# Patient Record
Sex: Female | Born: 1965
Health system: Southern US, Community
[De-identification: ages and names within clinical notes are randomized; demographics above are authoritative.]

## PROBLEM LIST (undated history)

## (undated) DIAGNOSIS — D649 Anemia, unspecified: Secondary | ICD-10-CM

## (undated) DIAGNOSIS — I10 Essential (primary) hypertension: Secondary | ICD-10-CM

## (undated) HISTORY — DX: Anemia, unspecified: D64.9

## (undated) HISTORY — PX: WISDOM TOOTH EXTRACTION: SHX21

## (undated) HISTORY — DX: Essential (primary) hypertension: I10

---

## 1999-01-31 ENCOUNTER — Other Ambulatory Visit: Admission: RE | Admit: 1999-01-31 | Discharge: 1999-01-31 | Payer: Self-pay | Admitting: *Deleted

## 1999-02-20 ENCOUNTER — Inpatient Hospital Stay (HOSPITAL_COMMUNITY): Admission: AD | Admit: 1999-02-20 | Discharge: 1999-02-20 | Payer: Self-pay | Admitting: Obstetrics and Gynecology

## 1999-02-28 ENCOUNTER — Inpatient Hospital Stay (HOSPITAL_COMMUNITY): Admission: AD | Admit: 1999-02-28 | Discharge: 1999-02-28 | Payer: Self-pay | Admitting: Obstetrics and Gynecology

## 1999-03-09 ENCOUNTER — Inpatient Hospital Stay (HOSPITAL_COMMUNITY): Admission: AD | Admit: 1999-03-09 | Discharge: 1999-03-09 | Payer: Self-pay | Admitting: Obstetrics and Gynecology

## 1999-07-26 ENCOUNTER — Inpatient Hospital Stay (HOSPITAL_COMMUNITY): Admission: AD | Admit: 1999-07-26 | Discharge: 1999-07-29 | Payer: Self-pay | Admitting: Obstetrics and Gynecology

## 1999-08-30 ENCOUNTER — Other Ambulatory Visit: Admission: RE | Admit: 1999-08-30 | Discharge: 1999-08-30 | Payer: Self-pay | Admitting: Obstetrics and Gynecology

## 2000-10-05 ENCOUNTER — Other Ambulatory Visit: Admission: RE | Admit: 2000-10-05 | Discharge: 2000-10-05 | Payer: Self-pay | Admitting: Obstetrics and Gynecology

## 2000-10-28 ENCOUNTER — Ambulatory Visit (HOSPITAL_BASED_OUTPATIENT_CLINIC_OR_DEPARTMENT_OTHER): Admission: RE | Admit: 2000-10-28 | Discharge: 2000-10-28 | Payer: Self-pay | Admitting: Orthopedic Surgery

## 2001-08-26 ENCOUNTER — Encounter: Admission: RE | Admit: 2001-08-26 | Discharge: 2001-08-26 | Payer: Self-pay | Admitting: Obstetrics and Gynecology

## 2001-08-26 ENCOUNTER — Encounter: Payer: Self-pay | Admitting: Obstetrics and Gynecology

## 2001-10-01 ENCOUNTER — Other Ambulatory Visit: Admission: RE | Admit: 2001-10-01 | Discharge: 2001-10-01 | Payer: Self-pay | Admitting: Obstetrics and Gynecology

## 2003-01-17 ENCOUNTER — Other Ambulatory Visit: Admission: RE | Admit: 2003-01-17 | Discharge: 2003-01-17 | Payer: Self-pay | Admitting: Obstetrics and Gynecology

## 2004-01-23 ENCOUNTER — Other Ambulatory Visit: Admission: RE | Admit: 2004-01-23 | Discharge: 2004-01-23 | Payer: Self-pay | Admitting: Obstetrics and Gynecology

## 2004-01-26 ENCOUNTER — Emergency Department (HOSPITAL_COMMUNITY): Admission: AD | Admit: 2004-01-26 | Discharge: 2004-01-26 | Payer: Self-pay | Admitting: Family Medicine

## 2005-01-22 ENCOUNTER — Other Ambulatory Visit: Admission: RE | Admit: 2005-01-22 | Discharge: 2005-01-22 | Payer: Self-pay | Admitting: Obstetrics and Gynecology

## 2005-03-18 ENCOUNTER — Emergency Department (HOSPITAL_COMMUNITY): Admission: EM | Admit: 2005-03-18 | Discharge: 2005-03-18 | Payer: Self-pay | Admitting: Family Medicine

## 2005-11-24 HISTORY — PX: BREAST SURGERY: SHX581

## 2006-11-02 ENCOUNTER — Ambulatory Visit (HOSPITAL_BASED_OUTPATIENT_CLINIC_OR_DEPARTMENT_OTHER): Admission: RE | Admit: 2006-11-02 | Discharge: 2006-11-03 | Payer: Self-pay | Admitting: Specialist

## 2011-11-21 ENCOUNTER — Emergency Department (HOSPITAL_COMMUNITY): Payer: 59

## 2011-11-21 ENCOUNTER — Emergency Department (HOSPITAL_COMMUNITY)
Admission: EM | Admit: 2011-11-21 | Discharge: 2011-11-21 | Disposition: A | Discharge status: Home / Self Care | Payer: 59 | Attending: Emergency Medicine | Admitting: Emergency Medicine

## 2011-11-21 DIAGNOSIS — M25559 Pain in unspecified hip: Secondary | ICD-10-CM

## 2011-11-21 MED ORDER — OXYCODONE-ACETAMINOPHEN 5-325 MG PO TABS: 1.0000 | ORAL_TABLET | Freq: Four times a day (QID) | ORAL | Status: AC | PRN

## 2011-11-21 MED ORDER — NAPROXEN 500 MG PO TABS: 500.0000 mg | ORAL_TABLET | Freq: Two times a day (BID) | ORAL | Status: AC

## 2011-11-21 MED ORDER — OXYCODONE-ACETAMINOPHEN 5-325 MG PO TABS
1.0000 | ORAL_TABLET | Freq: Once | ORAL | Status: AC
  Administered 2011-11-21: 1 via ORAL
  Filled 2011-11-21: qty 1

## 2011-11-21 NOTE — ED Notes (Signed)
sts this am was walking and left leg gave out and she fell. Describes more as a collapsed. Difficulty walking due to pain. Hands now have numbness tingling in bilateral arms and ankle on left leg.

## 2011-11-21 NOTE — ED Notes (Signed)
Pt getting dressed to be discharged home 

## 2011-11-21 NOTE — ED Notes (Signed)
Pt here from home, for left hip pain, sts leg gave out while walking down hall and collapsed on floor, now with worsening hip pain on left side, numbness and tingling in arms hands and ankle on left side.

## 2011-11-21 NOTE — Progress Notes (Signed)
Orthopedic Tech Progress Note Patient Details:  Penny Ward Oct 12, 1966 [id number]  Other Ortho Devices Type of Ortho Device: Crutches Ortho Device Interventions: Application   Nikki Dom 11/21/2011, 6:13 PM

## 2011-11-21 NOTE — ED Provider Notes (Signed)
History     CSN: [id number]  Arrival date & time 11/21/11  1312   First MD Initiated Contact with Patient 11/21/11 1511      Chief Complaint  Patient presents with  . Hip Pain    (Consider location/radiation/quality/duration/timing/severity/associated sxs/prior treatment) Patient is a 45 y.o. female presenting with hip pain. The history is provided by the patient (States that having severe pain in her left hip. Also some pain in her left. This pain started when she bent to to pick something up. Patient complains of pain with ambulation.).  Hip Pain This is a new problem. The current episode started 3 to 5 hours ago. The problem occurs constantly. The problem has not changed since onset.Pertinent negatives include no chest pain, no abdominal pain and no headaches. The symptoms are aggravated by bending and walking. The symptoms are relieved by nothing. She has tried nothing for the symptoms.    History reviewed. No pertinent past medical history.  History reviewed. No pertinent past surgical history.  History reviewed. No pertinent family history.  History  Substance Use Topics  . Smoking status: Never Smoker   . Smokeless tobacco: Not on file  . Alcohol Use: No    OB History    Grav Para Term Preterm Abortions TAB SAB Ect Mult Living                  Review of Systems  Constitutional: Negative for fatigue.  HENT: Negative for congestion, sinus pressure and ear discharge.   Eyes: Negative for discharge.  Respiratory: Negative for cough.   Cardiovascular: Negative for chest pain.  Gastrointestinal: Negative for abdominal pain and diarrhea.  Genitourinary: Negative for frequency and hematuria.  Musculoskeletal: Negative for back pain.       Moderate left hip pain  Skin: Negative for rash.  Neurological: Negative for seizures and headaches.  Hematological: Negative.   Psychiatric/Behavioral: Negative for hallucinations.    Allergies  Review of patient's allergies  indicates no known allergies.  Home Medications   Current Outpatient Rx  Name Route Sig Dispense Refill  . PRESCRIPTION MEDICATION Oral Take 1 tablet by mouth daily. Birth control pills     . NAPROXEN 500 MG PO TABS Oral Take 1 tablet (500 mg total) by mouth 2 (two) times daily. 30 tablet 0  . OXYCODONE-ACETAMINOPHEN 5-325 MG PO TABS Oral Take 1 tablet by mouth every 6 (six) hours as needed for pain. 30 tablet 0    BP 115/74  Pulse 75  Temp(Src) 98.5 F (36.9 C) (Oral)  Resp 18  SpO2 98%  Physical Exam  Constitutional: She is oriented to person, place, and time. She appears well-developed.  HENT:  Head: Normocephalic and atraumatic.  Eyes: Conjunctivae and EOM are normal. No scleral icterus.  Neck: Neck supple. No thyromegaly present.  Cardiovascular: Normal rate and regular rhythm.  Exam reveals no gallop and no friction rub.   No murmur heard. Pulmonary/Chest: No stridor. She has no wheezes. She has no rales. She exhibits no tenderness.  Abdominal: She exhibits no distension. There is no tenderness. There is no rebound.  Musculoskeletal: Normal range of motion. She exhibits tenderness. She exhibits no edema.       Patient has tenderness in left posterior hip with palpation. Patient also has severe pain left hip with flexion at the hip reflexes are normal neurovascular exam of the  Lymphadenopathy:    She has no cervical adenopathy.  Neurological: She is oriented to person, place, and time. She  displays normal reflexes. Coordination normal.  Skin: No rash noted. No erythema.  Psychiatric: She has a normal mood and affect. Her behavior is normal.    ED Course  Procedures (including critical care time)  Labs Reviewed - No data to display Dg Hip Complete Left  11/21/2011  *RADIOLOGY REPORT*  Clinical Data: Pain, acute onset  LEFT HIP - COMPLETE 2+ VIEW  Comparison: None.  Findings: No evidence of fracture, dislocation, degenerative change or other focal lesion.  Other bones  of the pelvis appear normal.  IMPRESSION: Normal radiographs  Original Report Authenticated By: Thomasenia Sales, M.D.     1. Hip pain       MDM          Benny Lennert, MD 11/21/11 [phone]

## 2012-12-16 ENCOUNTER — Other Ambulatory Visit: Payer: Self-pay | Admitting: Obstetrics and Gynecology

## 2012-12-16 DIAGNOSIS — N6009 Solitary cyst of unspecified breast: Secondary | ICD-10-CM

## 2012-12-27 ENCOUNTER — Ambulatory Visit
Admission: RE | Admit: 2012-12-27 | Discharge: 2012-12-27 | Disposition: A | Discharge status: Home / Self Care | Payer: BC Managed Care – PPO | Source: Ambulatory Visit | Attending: Obstetrics and Gynecology | Admitting: Obstetrics and Gynecology

## 2012-12-27 DIAGNOSIS — N6009 Solitary cyst of unspecified breast: Secondary | ICD-10-CM

## 2013-04-04 ENCOUNTER — Other Ambulatory Visit: Payer: Self-pay | Admitting: Obstetrics and Gynecology

## 2013-04-04 DIAGNOSIS — N6002 Solitary cyst of left breast: Secondary | ICD-10-CM

## 2013-04-15 ENCOUNTER — Ambulatory Visit
Admission: RE | Admit: 2013-04-15 | Discharge: 2013-04-15 | Disposition: A | Discharge status: Home / Self Care | Payer: BC Managed Care – PPO | Source: Ambulatory Visit | Attending: Obstetrics and Gynecology | Admitting: Obstetrics and Gynecology

## 2013-04-15 DIAGNOSIS — N6002 Solitary cyst of left breast: Secondary | ICD-10-CM

## 2013-10-11 IMAGING — MG MM DIGITAL DIAGNOSTIC BILAT
4 series · 4 of 4 positions shown · non-contrast
Comparison: With priors

CLINICAL DATA: Annual examination.  Follow-up from December 2012.
A cluster of benign cysts was seen in the left breast at 5 o'clock
positions subareolar.

DIGITAL DIAGNOSTIC BILATERAL MAMMOGRAM WITH CAD

[R CC]
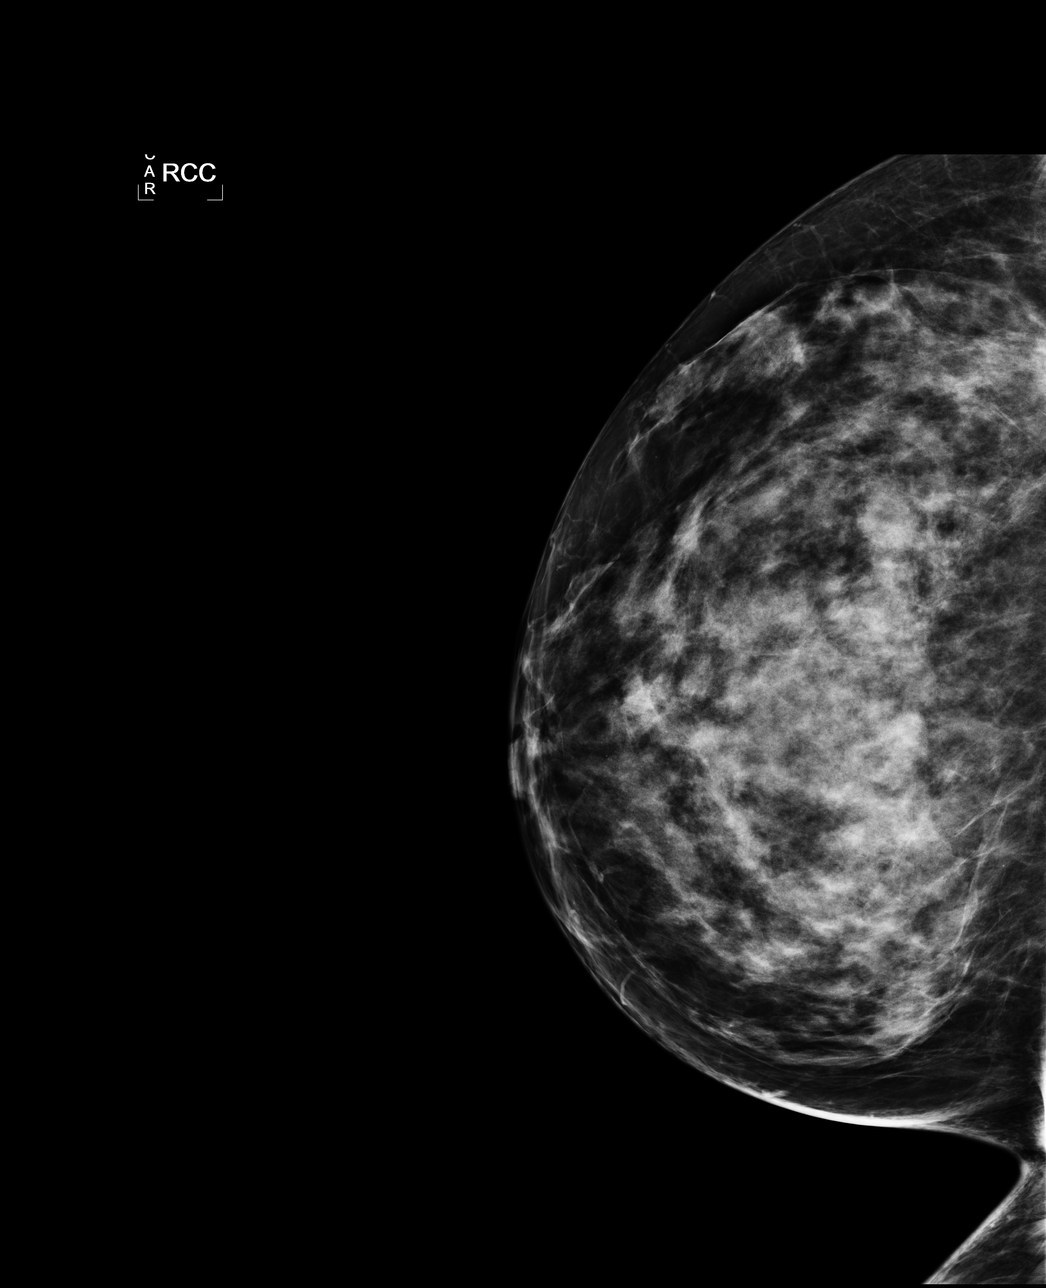

[L CC]
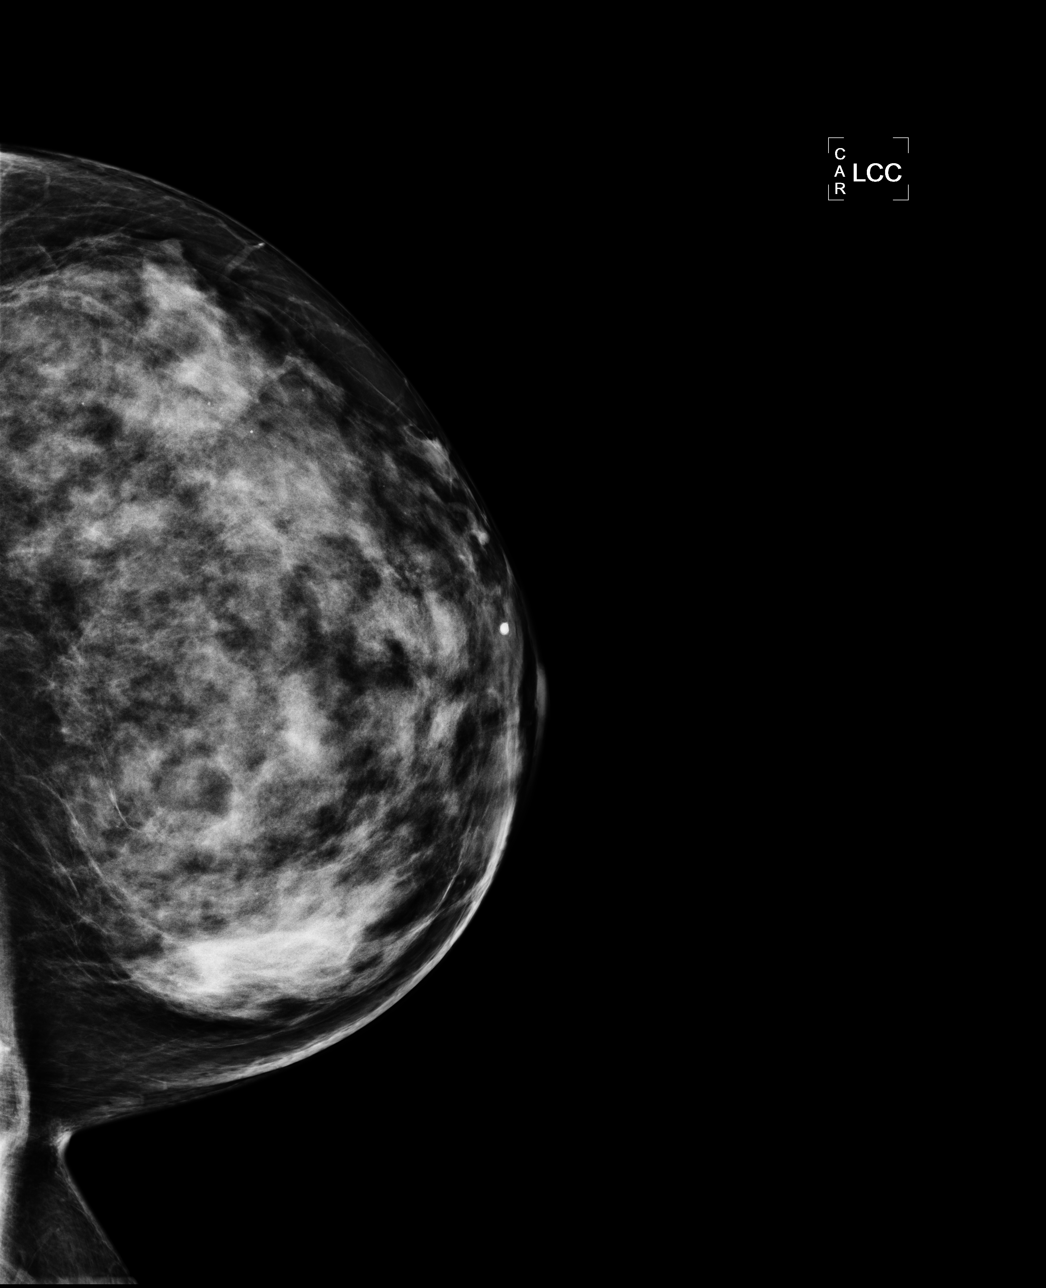

[L MLO]
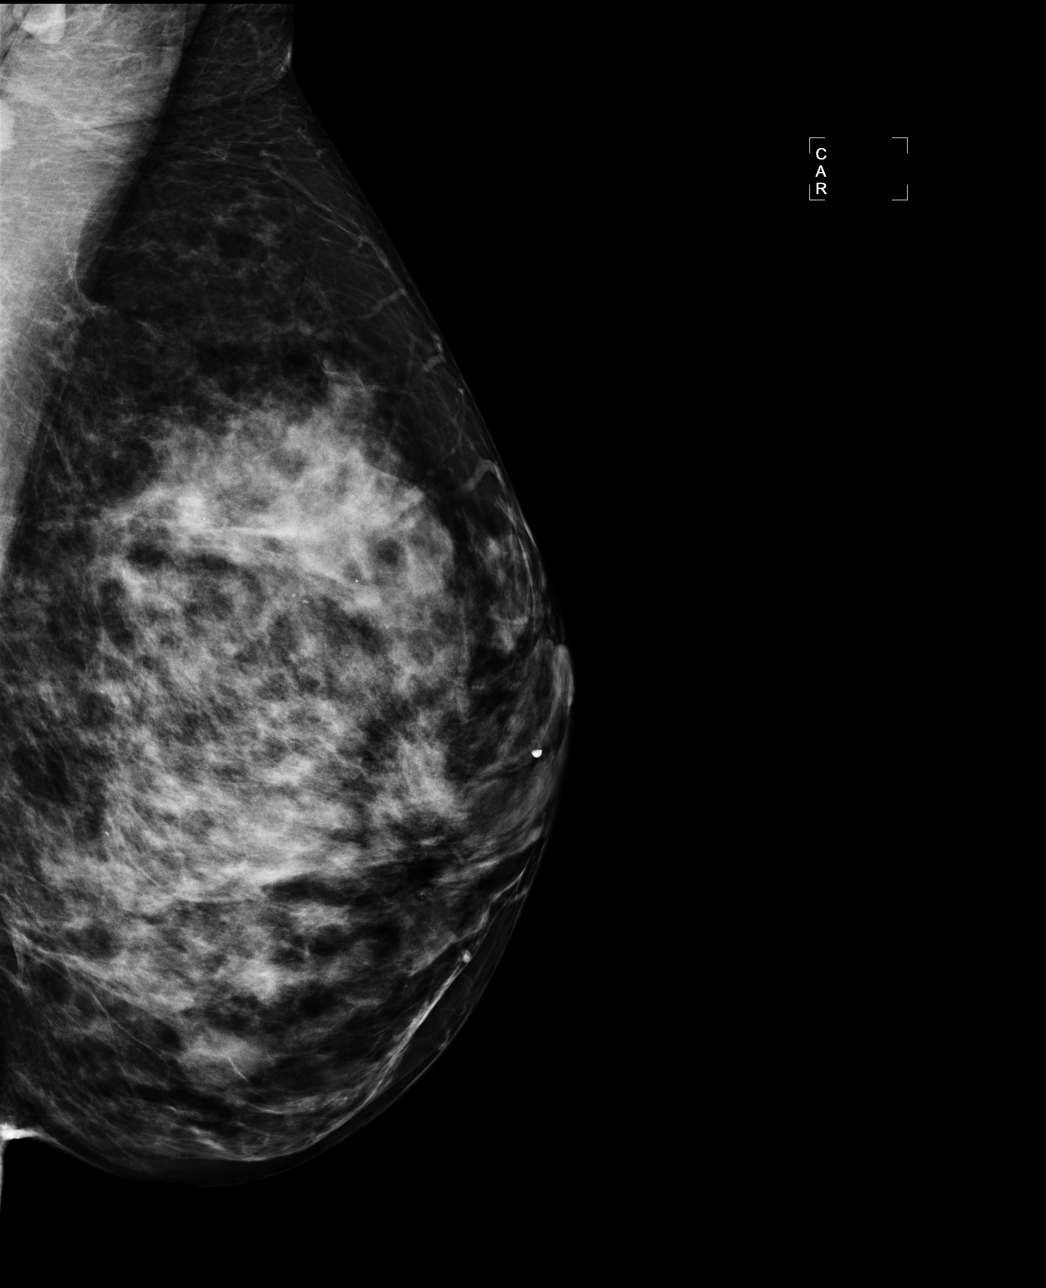

[R MLO]
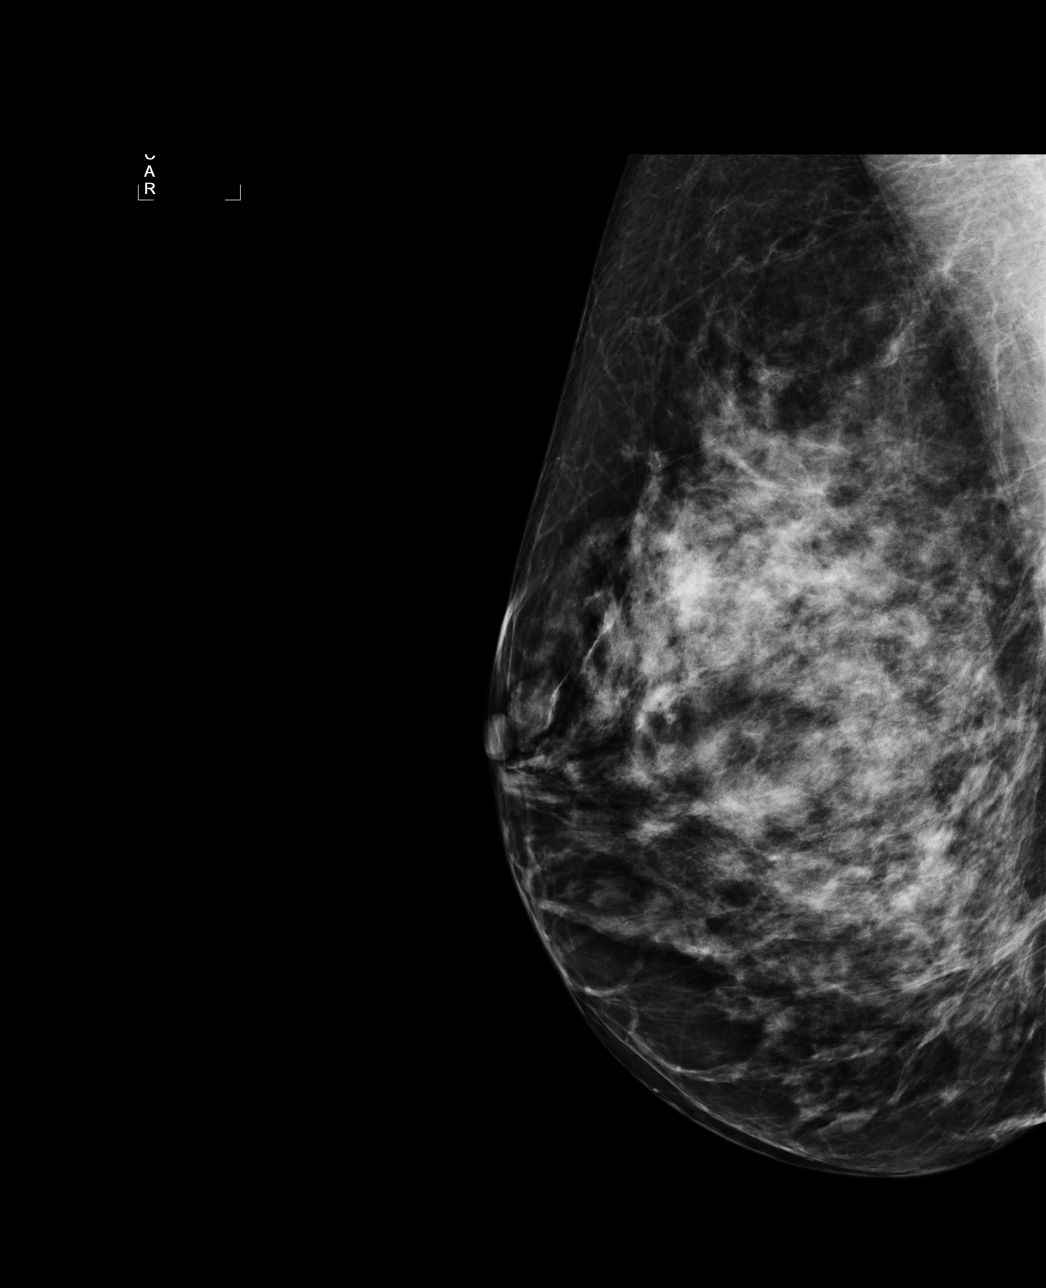

[4 of 4 positions shown; findings below may reference images not displayed]

FINDINGS: ACR Breast Density Category 3: The breast tissue is heterogeneously
dense.

There are reduction changes bilaterally.  Stable parenchymal
pattern.  No mass or distortion is identified in either breast.
There are stable, benign-appearing calcifications in the superior
left breast.  No suspicious mass is identified in either breast.

Mammographic images were processed with CAD.
IMPRESSION: Stable appearance of both breasts.  No evidence of malignancy.

RECOMMENDATION:
Bilateral screening mammogram in 1 year.

I have discussed the findings and recommendations with the patient.
Results were also provided in writing at the conclusion of the
visit.  If applicable, a reminder letter will be sent to the
patient regarding her next appointment.

BI-RADS CATEGORY 2:  Benign finding(s).

## 2013-12-11 ENCOUNTER — Encounter (HOSPITAL_COMMUNITY): Payer: Self-pay | Admitting: Emergency Medicine

## 2013-12-11 ENCOUNTER — Emergency Department (HOSPITAL_COMMUNITY)
Admission: EM | Admit: 2013-12-11 | Discharge: 2013-12-11 | Disposition: A | Discharge status: Home / Self Care | Payer: BC Managed Care – PPO | Source: Home / Self Care | Attending: Emergency Medicine | Admitting: Emergency Medicine

## 2013-12-11 DIAGNOSIS — J329 Chronic sinusitis, unspecified: Secondary | ICD-10-CM

## 2013-12-11 MED ORDER — AMOXICILLIN-POT CLAVULANATE 875-125 MG PO TABS: 1.0000 | ORAL_TABLET | Freq: Two times a day (BID) | ORAL | Status: DC

## 2013-12-11 NOTE — Discharge Instructions (Signed)
Finish all of the antibiotics, even if you are feeling better.    Sinusitis Sinusitis is redness, soreness, and swelling (inflammation) of the paranasal sinuses. Paranasal sinuses are air pockets within the bones of your face (beneath the eyes, the middle of the forehead, or above the eyes). In healthy paranasal sinuses, mucus is able to drain out, and air is able to circulate through them by way of your nose. However, when your paranasal sinuses are inflamed, mucus and air can become trapped. This can allow bacteria and other germs to grow and cause infection. Sinusitis can develop quickly and last only a short time (acute) or continue over a long period (chronic). Sinusitis that lasts for more than 12 weeks is considered chronic.  CAUSES  Causes of sinusitis include:  Allergies.  Structural abnormalities, such as displacement of the cartilage that separates your nostrils (deviated septum), which can decrease the air flow through your nose and sinuses and affect sinus drainage.  Functional abnormalities, such as when the small hairs (cilia) that line your sinuses and help remove mucus do not work properly or are not present. SYMPTOMS  Symptoms of acute and chronic sinusitis are the same. The primary symptoms are pain and pressure around the affected sinuses. Other symptoms include:  Upper toothache.  Earache.  Headache.  Bad breath.  Decreased sense of smell and taste.  A cough, which worsens when you are lying flat.  Fatigue.  Fever.  Thick drainage from your nose, which often is green and may contain pus (purulent).  Swelling and warmth over the affected sinuses. DIAGNOSIS  Your caregiver will perform a physical exam. During the exam, your caregiver may:  Look in your nose for signs of abnormal growths in your nostrils (nasal polyps).  Tap over the affected sinus to check for signs of infection.  View the inside of your sinuses (endoscopy) with a special imaging device  with a light attached (endoscope), which is inserted into your sinuses. If your caregiver suspects that you have chronic sinusitis, one or more of the following tests may be recommended:  Allergy tests.  Nasal culture A sample of mucus is taken from your nose and sent to a lab and screened for bacteria.  Nasal cytology A sample of mucus is taken from your nose and examined by your caregiver to determine if your sinusitis is related to an allergy. TREATMENT  Most cases of acute sinusitis are related to a viral infection and will resolve on their own within 10 days. Sometimes medicines are prescribed to help relieve symptoms (pain medicine, decongestants, nasal steroid sprays, or saline sprays).  However, for sinusitis related to a bacterial infection, your caregiver will prescribe antibiotic medicines. These are medicines that will help kill the bacteria causing the infection.  Rarely, sinusitis is caused by a fungal infection. In theses cases, your caregiver will prescribe antifungal medicine. For some cases of chronic sinusitis, surgery is needed. Generally, these are cases in which sinusitis recurs more than 3 times per year, despite other treatments. HOME CARE INSTRUCTIONS   Drink plenty of water. Water helps thin the mucus so your sinuses can drain more easily.  Use a humidifier.  Inhale steam 3 to 4 times a day (for example, sit in the bathroom with the shower running).  Apply a warm, moist washcloth to your face 3 to 4 times a day, or as directed by your caregiver.  Use saline nasal sprays to help moisten and clean your sinuses.  Take over-the-counter or prescription medicines  for pain, discomfort, or fever only as directed by your caregiver. SEEK IMMEDIATE MEDICAL CARE IF:  You have increasing pain or severe headaches.  You have nausea, vomiting, or drowsiness.  You have swelling around your face.  You have vision problems.  You have a stiff neck.  You have difficulty  breathing. MAKE SURE YOU:   Understand these instructions.  Will watch your condition.  Will get help right away if you are not doing well or get worse. Document Released: 11/10/2005 Document Revised: 02/02/2012 Document Reviewed: 11/25/2011 [facility] Patient Information 2014 [place], [place].

## 2013-12-11 NOTE — ED Provider Notes (Signed)
Medical screening examination/treatment/procedure(s) were performed by non-physician practitioner and as supervising physician I was immediately available for consultation/collaboration.  Philipp Deputy, M.D.   Harden Mo, MD 12/11/13 2122

## 2013-12-11 NOTE — ED Notes (Signed)
URI x 1 week , losing voice, Angulo clumpy secretions, minimal relief w OTC medications

## 2013-12-11 NOTE — ED Provider Notes (Signed)
CSN: [id number]     Arrival date & time 12/11/13  5916 History   First MD Initiated Contact with Patient 12/11/13 1109     Chief Complaint  Patient presents with  . URI   (Consider location/radiation/quality/duration/timing/severity/associated sxs/prior Treatment) Patient is a 48 y.o. female presenting with URI. The history is provided by the patient.  URI Presenting symptoms: congestion, cough, rhinorrhea and sore throat   Presenting symptoms: no ear pain and no fever   Severity:  Moderate Onset quality:  Gradual Duration:  8 days Timing:  Constant Progression:  Worsening Chronicity:  New Relieved by:  Nothing Worsened by:  Nothing tried Ineffective treatments:  OTC medications Associated symptoms: sinus pain     History reviewed. No pertinent past medical history. History reviewed. No pertinent past surgical history. History reviewed. No pertinent family history. History  Substance Use Topics  . Smoking status: Never Smoker   . Smokeless tobacco: Not on file  . Alcohol Use: No   OB History   Grav Para Term Preterm Abortions TAB SAB Ect Mult Living                 Review of Systems  Constitutional: Negative for fever and chills.  HENT: Positive for congestion, postnasal drip, rhinorrhea, sinus pressure, sore throat and voice change. Negative for ear pain.   Respiratory: Positive for cough. Negative for shortness of breath.     Allergies  Review of patient's allergies indicates no known allergies.  Home Medications   Current Outpatient Rx  Name  Route  Sig  Dispense  Refill  . amoxicillin-clavulanate (AUGMENTIN) 875-125 MG per tablet   Oral   Take 1 tablet by mouth 2 (two) times daily.   20 tablet   0   . PRESCRIPTION MEDICATION   Oral   Take 1 tablet by mouth daily. Birth control pills           BP 140/93  Pulse 79  Temp(Src) 98.7 F (37.1 C) (Oral)  Resp 18  SpO2 100%  LMP 11/06/2013 Physical Exam  Constitutional: She appears well-developed  and well-nourished. No distress.  HENT:  Right Ear: Tympanic membrane, external ear and ear canal normal.  Left Ear: Tympanic membrane, external ear and ear canal normal.  Nose: Nose normal.  Mouth/Throat: Oropharynx is clear and moist and mucous membranes are normal.  Cardiovascular: Normal rate and regular rhythm.   Pulmonary/Chest: Effort normal and breath sounds normal.    ED Course  Procedures (including critical care time) Labs Review Labs Reviewed - No data to display Imaging Review No results found.  EKG Interpretation    Date/Time:    Ventricular Rate:    PR Interval:    QRS Duration:   QT Interval:    QTC Calculation:   R Axis:     Text Interpretation:              MDM   1. Sinusitis   given pt sx, most likely bacterial sinusitis. Rx augmentin 875/125 BID #20, no refills.      Carvel Getting, NP 12/11/13 [phone]

## 2014-06-27 ENCOUNTER — Encounter (HOSPITAL_COMMUNITY): Payer: Self-pay | Admitting: Emergency Medicine

## 2014-06-27 ENCOUNTER — Emergency Department (INDEPENDENT_AMBULATORY_CARE_PROVIDER_SITE_OTHER)
Admission: EM | Admit: 2014-06-27 | Discharge: 2014-06-27 | Disposition: A | Discharge status: Home / Self Care | Payer: Self-pay | Source: Home / Self Care | Attending: Emergency Medicine | Admitting: Emergency Medicine

## 2014-06-27 DIAGNOSIS — S8002XA Contusion of left knee, initial encounter: Secondary | ICD-10-CM

## 2014-06-27 DIAGNOSIS — S8000XA Contusion of unspecified knee, initial encounter: Secondary | ICD-10-CM

## 2014-06-27 MED ORDER — IBUPROFEN 800 MG PO TABS
ORAL_TABLET | ORAL | Status: AC
  Filled 2014-06-27: qty 1

## 2014-06-27 MED ORDER — IBUPROFEN 800 MG PO TABS
800.0000 mg | ORAL_TABLET | Freq: Once | ORAL | Status: AC
  Administered 2014-06-27: 800 mg via ORAL
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
## 2014-06-27 NOTE — ED Provider Notes (Signed)
Medical screening examination/treatment/procedure(s) were performed by resident physician or non-physician practitioner and as supervising physician I was immediately available for consultation/collaboration.  Maryruth Eve, MD     Melony Overly, MD 06/27/14 2106

## 2014-12-14 ENCOUNTER — Encounter: Payer: Self-pay | Admitting: *Deleted

## 2014-12-14 ENCOUNTER — Ambulatory Visit: Payer: Self-pay | Admitting: Internal Medicine

## 2014-12-27 ENCOUNTER — Encounter: Payer: Self-pay | Admitting: *Deleted

## 2015-01-03 ENCOUNTER — Encounter: Payer: Self-pay | Admitting: Internal Medicine

## 2015-01-03 ENCOUNTER — Ambulatory Visit (INDEPENDENT_AMBULATORY_CARE_PROVIDER_SITE_OTHER): Payer: BLUE CROSS/BLUE SHIELD | Admitting: Internal Medicine

## 2015-01-03 VITALS — BP 142/92 | HR 86 | Temp 98.6°F | Resp 18 | Ht 66.54 in | Wt 182.5 lb

## 2015-01-03 DIAGNOSIS — E663 Overweight: Secondary | ICD-10-CM

## 2015-01-03 DIAGNOSIS — R030 Elevated blood-pressure reading, without diagnosis of hypertension: Secondary | ICD-10-CM

## 2015-01-03 DIAGNOSIS — Z3041 Encounter for surveillance of contraceptive pills: Secondary | ICD-10-CM
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
## 2015-01-03 NOTE — Patient Instructions (Addendum)
Low-Sodium Eating Plan Sodium raises blood pressure and causes water to be held in the body. Getting less sodium from food will help lower your blood pressure, reduce any swelling, and protect your heart, liver, and kidneys. We get sodium by adding salt (sodium chloride) to food. Most of our sodium comes from canned, boxed, and frozen foods. Restaurant foods, fast foods, and pizza are also very high in sodium. Even if you take medicine to lower your blood pressure or to reduce fluid in your body, getting less sodium from your food is important. WHAT IS MY PLAN? Most people should limit their sodium intake to 2,300 mg a day. Your health care provider recommends that you limit your sodium intake to __________ a day.  WHAT DO I NEED TO KNOW ABOUT THIS EATING PLAN? For the low-sodium eating plan, you will follow these general guidelines:  Choose foods with a % Daily Value for sodium of less than 5% (as listed on the food label).   Use salt-free seasonings or herbs instead of table salt or sea salt.   Check with your health care provider or pharmacist before using salt substitutes.   Eat fresh foods.  Eat more vegetables and fruits.  Limit canned vegetables. If you do use them, rinse them well to decrease the sodium.   Limit cheese to 1 oz (28 g) per day.   Eat lower-sodium products, often labeled as "lower sodium" or "no salt added."  Avoid foods that contain monosodium glutamate (MSG). MSG is sometimes added to [place] food and some canned foods.  Check food labels (Nutrition Facts labels) on foods to learn how much sodium is in one serving.  Eat more home-cooked food and less restaurant, buffet, and fast food.  When eating at a restaurant, ask that your food be prepared with less salt or none, if possible.  HOW DO I READ FOOD LABELS FOR SODIUM INFORMATION? The Nutrition Facts label lists the amount of sodium in one serving of the food. If you eat more than one serving, you must  multiply the listed amount of sodium by the number of servings. Food labels may also identify foods as:  Sodium free--Less than 5 mg in a serving.  Very low sodium--35 mg or less in a serving.  Low sodium--140 mg or less in a serving.  Light in sodium--50% less sodium in a serving. For example, if a food that usually has 300 mg of sodium is changed to become light in sodium, it will have 150 mg of sodium.  Reduced sodium--25% less sodium in a serving. For example, if a food that usually has 400 mg of sodium is changed to reduced sodium, it will have 300 mg of sodium. WHAT FOODS CAN I EAT? Grains Low-sodium cereals, including oats, puffed wheat and rice, and shredded wheat cereals. Low-sodium crackers. Unsalted rice and pasta. Lower-sodium bread.  Vegetables Frozen or fresh vegetables. Low-sodium or reduced-sodium canned vegetables. Low-sodium or reduced-sodium tomato sauce and paste. Low-sodium or reduced-sodium tomato and vegetable juices.  Fruits Fresh, frozen, and canned fruit. Fruit juice.  Meat and Other Protein Products Low-sodium canned tuna and salmon. Fresh or frozen meat, poultry, seafood, and fish. Lamb. Unsalted nuts. Dried beans, peas, and lentils without added salt. Unsalted canned beans. Homemade soups without salt. Eggs.  Dairy Milk. Soy milk. Ricotta cheese. Low-sodium or reduced-sodium cheeses. Yogurt.  Condiments Fresh and dried herbs and spices. Salt-free seasonings. Onion and garlic powders. Low-sodium varieties of mustard and ketchup. Lemon juice.  Fats and Oils  Reduced-sodium salad dressings. Unsalted butter.  Other Unsalted popcorn and pretzels.  The items listed above may not be a complete list of recommended foods or beverages. Contact your dietitian for more options. WHAT FOODS ARE NOT RECOMMENDED? Grains Instant hot cereals. Bread stuffing, pancake, and biscuit mixes. Croutons. Seasoned rice or pasta mixes. Noodle soup cups. Boxed or frozen  macaroni and cheese. Self-rising flour. Regular salted crackers. Vegetables Regular canned vegetables. Regular canned tomato sauce and paste. Regular tomato and vegetable juices. Frozen vegetables in sauces. Salted french fries. Olives. Angie Fava. Relishes. Sauerkraut. Salsa. Meat and Other Protein Products Salted, canned, smoked, spiced, or pickled meats, seafood, or fish. Bacon, ham, sausage, hot dogs, corned beef, chipped beef, and packaged luncheon meats. Salt pork. Jerky. Pickled herring. Anchovies, regular canned tuna, and sardines. Salted nuts. Dairy Processed cheese and cheese spreads. Cheese curds. Blue cheese and cottage cheese. Buttermilk.  Condiments Onion and garlic salt, seasoned salt, table salt, and sea salt. Canned and packaged gravies. Worcestershire sauce. Tartar sauce. Barbecue sauce. Teriyaki sauce. Soy sauce, including reduced sodium. Steak sauce. Fish sauce. Oyster sauce. Cocktail sauce. Horseradish. Regular ketchup and mustard. Meat flavorings and tenderizers. Bouillon cubes. Hot sauce. Tabasco sauce. Marinades. Taco seasonings. Relishes. Fats and Oils Regular salad dressings. Salted butter. Margarine. Ghee. Bacon fat.  Other Potato and tortilla chips. Corn chips and puffs. Salted popcorn and pretzels. Canned or dried soups. Pizza. Frozen entrees and pot pies.  The items listed above may not be a complete list of foods and beverages to avoid. Contact your dietitian for more information. Document Released: 05/02/2002 Document Revised: 11/15/2013 Document Reviewed: 09/14/2013 [facility] Patient Information 2015 [place], [place]. This information is not intended to replace advice given to you by your health care provider. Make sure you discuss any questions you have with your health care provider.   Calorie Counting for Weight Loss Calories are energy you get from the things you eat and drink. Your body uses this energy to keep you going throughout the day. The number of  calories you eat affects your weight. When you eat more calories than your body needs, your body stores the extra calories as fat. When you eat fewer calories than your body needs, your body burns fat to get the energy it needs. Calorie counting means keeping track of how many calories you eat and drink each day. If you make sure to eat fewer calories than your body needs, you should lose weight. In order for calorie counting to work, you will need to eat the number of calories that are right for you in a day to lose a healthy amount of weight per week. A healthy amount of weight to lose per week is usually 1-2 lb (0.5-0.9 kg). A dietitian can determine how many calories you need in a day and give you suggestions on how to reach your calorie goal.  WHAT IS MY MY PLAN? My goal is to have __________ calories per day.  If I have this many calories per day, I should lose around __________ pounds per week. WHAT DO I NEED TO KNOW ABOUT CALORIE COUNTING? In order to meet your daily calorie goal, you will need to:  Find out how many calories are in each food you would like to eat. Try to do this before you eat.  Decide how much of the food you can eat.  Write down what you ate and how many calories it had. Doing this is called keeping a food log. WHERE DO I FIND CALORIE  INFORMATION? The number of calories in a food can be found on a Nutrition Facts label. Note that all the information on a label is based on a specific serving of the food. If a food does not have a Nutrition Facts label, try to look up the calories online or ask your dietitian for help. HOW DO I DECIDE HOW MUCH TO EAT? To decide how much of the food you can eat, you will need to consider both the number of calories in one serving and the size of one serving. This information can be found on the Nutrition Facts label. If a food does not have a Nutrition Facts label, look up the information online or ask your dietitian for help. Remember that  calories are listed per serving. If you choose to have more than one serving of a food, you will have to multiply the calories per serving by the amount of servings you plan to eat. For example, the label on a package of bread might say that a serving size is 1 slice and that there are 90 calories in a serving. If you eat 1 slice, you will have eaten 90 calories. If you eat 2 slices, you will have eaten 180 calories. HOW DO I KEEP A FOOD LOG? After each meal, record the following information in your food log:  What you ate.  How much of it you ate.  How many calories it had.  Then, add up your calories. Keep your food log near you, such as in a small notebook in your pocket. Another option is to use a mobile app or website. Some programs will calculate calories for you and show you how many calories you have left each time you add an item to the log. WHAT ARE SOME CALORIE COUNTING TIPS?  Use your calories on foods and drinks that will fill you up and not leave you hungry. Some examples of this include foods like nuts and nut butters, vegetables, lean proteins, and high-fiber foods (more than 5 g fiber per serving).  Eat nutritious foods and avoid empty calories. Empty calories are calories you get from foods or beverages that do not have many nutrients, such as candy and soda. It is better to have a nutritious high-calorie food (such as an avocado) than a food with few nutrients (such as a bag of chips).  Know how many calories are in the foods you eat most often. This way, you do not have to look up how many calories they have each time you eat them.  Look out for foods that may seem like low-calorie foods but are really high-calorie foods, such as baked goods, soda, and fat-free candy.  Pay attention to calories in drinks. Drinks such as sodas, specialty coffee drinks, alcohol, and juices have a lot of calories yet do not fill you up. Choose low-calorie drinks like water and diet  drinks.  Focus your calorie counting efforts on higher calorie items. Logging the calories in a garden salad that contains only vegetables is less important than calculating the calories in a milk shake.  Find a way of tracking calories that works for you. Get creative. Most people who are successful find ways to keep track of how much they eat in a day, even if they do not count every calorie. WHAT ARE SOME PORTION CONTROL TIPS?  Know how many calories are in a serving. This will help you know how many servings of a certain food you can have.  Use  a measuring cup to measure serving sizes. This is helpful when you start out. With time, you will be able to estimate serving sizes for some foods.  Take some time to put servings of different foods on your favorite plates, bowls, and cups so you know what a serving looks like.  Try not to eat straight from a bag or box. Doing this can lead to overeating. Put the amount you would like to eat in a cup or on a plate to make sure you are eating the right portion.  Use smaller plates, glasses, and bowls to prevent overeating. This is a quick and easy way to practice portion control. If your plate is smaller, less food can fit on it.  Try not to multitask while eating, such as watching TV or using your computer. If it is time to eat, sit down at a table and enjoy your food. Doing this will help you to start recognizing when you are full. It will also make you more aware of what and how much you are eating. HOW CAN I CALORIE COUNT WHEN EATING OUT?  Ask for smaller portion sizes or child-sized portions.  Consider sharing an entree and sides instead of getting your own entree.  If you get your own entree, eat only half. Ask for a box at the beginning of your meal and put the rest of your entree in it so you are not tempted to eat it.  Look for the calories on the menu. If calories are listed, choose the lower calorie options.  Choose dishes that  include vegetables, fruits, whole grains, low-fat dairy products, and lean protein. Focusing on smart food choices from each of the 5 food groups can help you stay on track at restaurants.  Choose items that are boiled, broiled, grilled, or steamed.  Choose water, milk, unsweetened iced tea, or other drinks without added sugars. If you want an alcoholic beverage, choose a lower calorie option. For example, a regular margarita can have up to 700 calories and a glass of wine has around 150.  Stay away from items that are buttered, battered, fried, or served with cream sauce. Items labeled "crispy" are usually fried, unless stated otherwise.  Ask for dressings, sauces, and syrups on the side. These are usually very high in calories, so do not eat much of them.  Watch out for salads. Many people think salads are a healthy option, but this is often not the case. Many salads come with bacon, fried chicken, lots of cheese, fried chips, and dressing. All of these items have a lot of calories. If you want a salad, choose a garden salad and ask for grilled meats or steak. Ask for the dressing on the side, or ask for olive oil and vinegar or lemon to use as dressing.  Estimate how many servings of a food you are given. For example, a serving of cooked rice is  cup or about the size of half a tennis ball or one cupcake wrapper. Knowing serving sizes will help you be aware of how much food you are eating at restaurants. The list below tells you how big or small some common portion sizes are based on everyday objects.  1 oz--4 stacked dice.  3 oz--1 deck of cards.  1 tsp--1 dice.  1 Tbsp-- a Ping-Pong ball.  2 Tbsp--1 Ping-Pong ball.   cup--1 tennis ball or 1 cupcake wrapper.  1 cup--1 baseball. Document Released: 11/10/2005 Document Revised: 03/27/2014 Document Reviewed: 09/15/2013 [employer]  Patient Information 2015 [place]. This information is not intended to replace advice given to you by  your health care provider. Make sure you discuss any questions you have with your health care provider.

## 2015-01-04 LAB — COMPREHENSIVE METABOLIC PANEL
ALT: 7 IU/L (ref 0–32)
AST: 10 IU/L (ref 0–40)
Albumin/Globulin Ratio: 1.4 (ref 1.1–2.5)
Albumin: 4.1 g/dL (ref 3.5–5.5)
Alkaline Phosphatase: 45 IU/L (ref 39–117)
BUN/Creatinine Ratio: 18 (ref 9–23)
BUN: 14 mg/dL (ref 6–24)
Bilirubin Total: 0.2 mg/dL (ref 0.0–1.2)
CO2: 21 mmol/L (ref 18–29)
Calcium: 9 mg/dL (ref 8.7–10.2)
Chloride: 103 mmol/L (ref 97–108)
Creatinine, Ser: 0.76 mg/dL (ref 0.57–1.00)
GFR calc Af Amer: 109 mL/min/{1.73_m2} (ref >59)
GFR calc non Af Amer: 94 mL/min/{1.73_m2} (ref >59)
Globulin, Total: 3 g/dL (ref 1.5–4.5)
Glucose: 85 mg/dL (ref 65–99)
Potassium: 4.3 mmol/L (ref 3.5–5.2)
Sodium: 139 mmol/L (ref 134–144)
Total Protein: 7.1 g/dL (ref 6.0–8.5)

## 2015-01-04 LAB — CBC WITH DIFFERENTIAL/PLATELET
Basophils Absolute: 0 10*3/uL (ref 0.0–0.2)
Basos: 0 %
Eos: 1 %
Eosinophils Absolute: 0 10*3/uL (ref 0.0–0.4)
HCT: 29.7 % — ABNORMAL LOW (ref 34.0–46.6)
Hemoglobin: 9.6 g/dL — ABNORMAL LOW (ref 11.1–15.9)
Immature Grans (Abs): 0 10*3/uL (ref 0.0–0.1)
Immature Granulocytes: 0 %
Lymphocytes Absolute: 2.3 10*3/uL (ref 0.7–3.1)
Lymphs: 38 %
MCH: 27.7 pg (ref 26.6–33.0)
MCHC: 32.3 g/dL (ref 31.5–35.7)
MCV: 86 fL (ref 79–97)
Monocytes Absolute: 0.3 10*3/uL (ref 0.1–0.9)
Monocytes: 5 %
Neutrophils Absolute: 3.3 10*3/uL (ref 1.4–7.0)
Neutrophils Relative %: 56 %
Platelets: 567 10*3/uL — ABNORMAL HIGH (ref 150–379)
RBC: 3.46 x10E6/uL — ABNORMAL LOW (ref 3.77–5.28)
RDW: 13 % (ref 12.3–15.4)
WBC: 5.9 10*3/uL (ref 3.4–10.8)

## 2015-01-04 LAB — LIPID PANEL
Chol/HDL Ratio: 2.7 ratio units (ref 0.0–4.4)
Cholesterol, Total: 225 mg/dL — ABNORMAL HIGH (ref 100–199)
HDL: 82 mg/dL (ref >39)
LDL Calculated: 122 mg/dL — ABNORMAL HIGH (ref 0–99)
Triglycerides: 105 mg/dL (ref 0–149)
VLDL Cholesterol Cal: 21 mg/dL (ref 5–40)

## 2015-01-04 LAB — TSH: TSH: 1.59 u[IU]/mL (ref 0.450–4.500)

## 2015-01-06 LAB — FERRITIN: Ferritin: 5 ng/mL — ABNORMAL LOW (ref 15–150)

## 2015-01-06 LAB — IRON AND TIBC
Iron Saturation: 3 % — CL (ref 15–55)
Iron: 16 ug/dL — ABNORMAL LOW (ref 27–159)
Total Iron Binding Capacity: 494 ug/dL — ABNORMAL HIGH (ref 250–450)
UIBC: 478 ug/dL — ABNORMAL HIGH (ref 131–425)

## 2015-01-06 LAB — SPECIMEN STATUS REPORT

## 2015-01-11 ENCOUNTER — Other Ambulatory Visit: Payer: Self-pay | Admitting: Obstetrics and Gynecology

## 2015-01-12 LAB — CYTOLOGY - PAP

## 2015-04-04 ENCOUNTER — Encounter: Payer: BLUE CROSS/BLUE SHIELD | Admitting: Internal Medicine

## 2015-09-26 ENCOUNTER — Ambulatory Visit (INDEPENDENT_AMBULATORY_CARE_PROVIDER_SITE_OTHER): Payer: BLUE CROSS/BLUE SHIELD | Admitting: Internal Medicine

## 2015-09-26 ENCOUNTER — Encounter: Payer: Self-pay | Admitting: Internal Medicine

## 2015-09-26 VITALS — BP 118/80 | HR 85 | Temp 98.7°F | Ht 67.0 in | Wt 181.0 lb

## 2015-09-26 DIAGNOSIS — J069 Acute upper respiratory infection, unspecified: Secondary | ICD-10-CM | POA: Insufficient documentation

## 2015-09-26 MED ORDER — TETANUS-DIPHTH-ACELL PERTUSSIS 5-2.5-18.5 LF-MCG/0.5 IM SUSP: 0.5000 mL | Freq: Once | INTRAMUSCULAR | Status: DC

## 2015-09-26 NOTE — Patient Instructions (Signed)
Use doxycyline if you begin a fever or cough up blood streaks. Delsym may help cough as well as other medications with dextromethorphan.

## 2015-09-26 NOTE — Progress Notes (Signed)
Patient ID: Penny Ward, female   DOB: 1966/03/01, 49 y.o.   MRN: [id number]    Facility  [place]    Place of Service:   OFFICE    No Known Allergies  Chief Complaint  Patient presents with  . Acute Visit    Sore throat since Saturday, resolved yesterday. Patient c/o discolored (yellow/[place]) phlegm, cough, sneezing and loss of appetite   . Immunizations    Discuss if patient should wait for flu vaccine or get it today     HPI:  Acute upper respiratory infection recently with significant sore throat 4 days ago. Throat began clearing up yesterday. She then developed a cough and has been producing some [place] yellow sputum. There's been no blood in the sputum. She has not run a fever. She denies chest pain. Ears are normal.  Medications: Patient's Medications  New Prescriptions   No medications on file  Previous Medications   DROSPIRENONE-ETHINYL ESTRADIOL (YAZ,GIANVI,LORYNA) 3-0.02 MG TABLET    Take 1 tablet by mouth daily.   FE FUM-FE POLY-VIT C-LACTOBAC (FUSION) 65-65-25-30 MG CAPS    Take by mouth daily.   IBUPROFEN (ADVIL,MOTRIN) 200 MG TABLET    Take 200 mg by mouth every 6 (six) hours as needed.  Modified Medications   Modified Medication Previous Medication   TDAP (BOOSTRIX) 5-2.5-18.5 LF-MCG/0.5 INJECTION Tdap (BOOSTRIX) 5-2.5-18.5 LF-MCG/0.5 injection      Inject 0.5 mLs into the muscle once.    Inject 0.5 mLs into the muscle once.  Discontinued Medications   No medications on file    Review of Systems  Constitutional: Positive for fatigue. Negative for fever and chills.  HENT: Positive for sneezing and sore throat. Negative for mouth sores and trouble swallowing.   Eyes: Positive for visual disturbance (wears glasses).  Respiratory: Positive for cough.   Cardiovascular: Negative.   Gastrointestinal: Negative.   Endocrine: Negative.   Musculoskeletal: Negative.   Neurological: Negative.   Hematological: Negative.   Psychiatric/Behavioral: Positive for sleep  disturbance. The patient is not nervous/anxious.        Increased stress     Filed Vitals:   09/26/15 1242  BP: 118/80  Pulse: 85  Temp: 98.7 F (37.1 C)  TempSrc: Oral  Height: _0  (1.702 m)  Weight: 181 lb (82.101 kg)  SpO2: 98%   Body mass index is 28.34 kg/(m^2).  Physical Exam  Constitutional: She is oriented to person, place, and time. She appears well-developed and well-nourished. No distress.  HENT:  Right Ear: External ear normal.  Left Ear: External ear normal.  Nose: Nose normal.  Mouth/Throat: Oropharynx is clear and moist. No oropharyngeal exudate.  Eyes: Conjunctivae and EOM are normal. Pupils are equal, round, and reactive to light. No scleral icterus.  Neck: No JVD present. No tracheal deviation present. No thyromegaly present.  Cardiovascular: Normal rate, regular rhythm, normal heart sounds and intact distal pulses.  Exam reveals no gallop and no friction rub.   No murmur heard. Pulmonary/Chest: Effort normal. No respiratory distress. She has no wheezes. She has no rales. She exhibits no tenderness.  Abdominal: She exhibits no distension and no mass. There is no tenderness.  Musculoskeletal: Normal range of motion. She exhibits no edema or tenderness.  Lymphadenopathy:    She has no cervical adenopathy.  Neurological: She is alert and oriented to person, place, and time. No cranial nerve deficit. Coordination normal.  Skin: No rash noted. She is not diaphoretic. No erythema. No pallor.  Psychiatric: She has a normal  mood and affect. Her behavior is normal. Judgment and thought content normal.    Labs reviewed: Lab Summary Latest Ref Rng 01/03/2015  Hemoglobin 11.1 - 15.9 g/dL 9.6(L)  Hematocrit 34.0 - 46.6 % 29.7(L)  White count 3.4 - 10.8 x10E3/uL 5.9  Platelet count 150 - 379 x10E3/uL 567(H)  Sodium 134 - 144 mmol/L 139  Potassium 3.5 - 5.2 mmol/L 4.3  Calcium 8.7 - 10.2 mg/dL 9.0  Phosphorus - (None)  Creatinine 0.57 - 1.00 mg/dL 0.76  AST 0 -  40 IU/L 10  Alk Phos 39 - 117 IU/L 45  Bilirubin 0.0 - 1.2 mg/dL 0.2  Glucose 65 - 99 mg/dL 85  Cholesterol - (None)  HDL cholesterol >39 mg/dL 82  Triglycerides 0 - 149 mg/dL 105  LDL Direct - (None)  LDL Calc 0 - 99 mg/dL 122(H)  Total protein - (None)  Albumin 3.5 - 5.5 g/dL 4.1   Lab Results  Component Value Date   TSH 1.590 01/03/2015   Lab Results  Component Value Date   BUN 14 01/03/2015   No results found for: HGBA1C  Assessment/Plan  1. Acute upper respiratory infection [facility] prescription for doxycycline 100 mg twice daily 7 days was written Vitals patient to try Delsym or other cough medicines with dextromethorphan if she needs cough suppression.

## 2015-11-09 NOTE — H&P (Signed)
Penny Ward, Penny Ward                  ACCOUNT NO.:  [id number]  MEDICAL RECORD NO.:  [id number]  LOCATION:                                FACILITY:  [facility]  PHYSICIAN:  Ralene Bathe. Matthew Saras, M.D.DATE OF BIRTH:  January 19, 1966  DATE OF ADMISSION:  11/29/2015 DATE OF DISCHARGE:                             HISTORY & PHYSICAL   CHIEF COMPLAINT:  Menorrhagia with anemia, history of fibroids.  HPI:  A 49 year old, G1, P1, on Gianvi with a 1 year history of menorrhagia and anemia despite being on OCPs.  A year ago, when she had an MRI of her evaluation of lower back pain, 9 cm posterior fibroid was noted.  Followup ultrasound in our office, March 2016, showed several large fibroids 10 cm, 7.3, 4.7, 3.6, and 2.5.  She presents now for definitive TAH bilateral salpingectomy with conservation of ovaries if they are otherwise normal.  This procedure including specific risks related to bleeding, infection, transfusion, adjacent organ injury, wound infection, phlebitis along with her expected recovery time discussed.  She has been on oral iron.  Last Pap was February 2016, which was normal.  PAST MEDICAL HISTORY:  Allergies:  None.  CURRENT MEDICATIONS:  Iron, Gianvi.  PAST SURGICAL HISTORY:  Prior breast reduction.  FAMILY HISTORY:  Significant for heart disease, thyroid disease, UTI, arthritis, and hypertension.  SOCIAL HISTORY:  Denies alcohol, tobacco, or drug use.  She has married, Gildardo Cranker, DO is her medical doctor.  PHYSICAL EXAMINATION:  VITAL SIGNS:  Temp 98.2, blood pressure 132/88. HEENT:  Unremarkable. NECK:  Supple without masses. LUNGS:  Clear. CARDIOVASCULAR:  Regular rate and rhythm without murmurs, rubs, or gallops. BREASTS:  Without masses. ABDOMEN:  Soft, flat, nontender.  Vulva, vagina, cervix normal.  Uterus was 14 week size.  Adnexa negative. EXTREMITIES:  Unremarkable. NEUROLOGIC:  Unremarkable.  IMPRESSION:  Symptomatic fibroids with anemia.  PLAN:  TAH  bilateral salpingectomy.  Procedure and risks reviewed as above.     Mettie Roylance M. Matthew Saras, M.D.     [facility]/MEDQ  D:  11/09/2015  T:  11/09/2015  Job:  [id number]

## 2015-11-09 NOTE — H&P (Cosign Needed)
RAELINN GIAMPA  DICTATION # [id number] CSN# [id number]   Margarette Asal, MD 11/09/2015 9:53 AM

## 2015-11-23 NOTE — Patient Instructions (Signed)
Your procedure is scheduled on:  Thursday, Jan. 5, 2017  Enter through the [employer] [facility] at:  6:00 A.M.  Pick up the phone at the desk and dial [phone].  Call this number if you have problems the morning of surgery: [phone].  Remember: Do NOT eat food or drink after:  Midnight Wednesday, Jan. 4, 2017 Take these medicines the morning of surgery with a SIP OF WATER:  None  Do NOT wear jewelry (body piercing), metal hair clips/bobby pins, make-up, or nail polish. Do NOT wear lotions, powders, or perfumes.  You may wear deoderant. Do NOT shave for 48 hours prior to surgery. Do NOT bring valuables to the hospital. Contacts, dentures, or bridgework may not be worn into surgery. Leave suitcase in car.  After surgery it may be brought to your room.  For patients admitted to the hospital, checkout time is 11:00 AM the day of discharge.

## 2015-11-27 ENCOUNTER — Encounter (HOSPITAL_COMMUNITY): Payer: Self-pay

## 2015-11-27 ENCOUNTER — Encounter (HOSPITAL_COMMUNITY)
Admission: RE | Admit: 2015-11-27 | Discharge: 2015-11-27 | Disposition: A | Discharge status: Home / Self Care | Payer: BLUE CROSS/BLUE SHIELD | Source: Ambulatory Visit | Attending: Obstetrics and Gynecology | Admitting: Obstetrics and Gynecology

## 2015-11-27 LAB — CBC
HCT: 30.7 % — ABNORMAL LOW (ref 36.0–46.0)
Hemoglobin: 9.8 g/dL — ABNORMAL LOW (ref 12.0–15.0)
MCH: 26.7 pg (ref 26.0–34.0)
MCHC: 31.9 g/dL (ref 30.0–36.0)
MCV: 83.7 fL (ref 78.0–100.0)
Platelets: 502 10*3/uL — ABNORMAL HIGH (ref 150–400)
RBC: 3.67 MIL/uL — ABNORMAL LOW (ref 3.87–5.11)
RDW: 14.2 % (ref 11.5–15.5)
WBC: 6.1 10*3/uL (ref 4.0–10.5)

## 2015-11-28 MED ORDER — DEXTROSE 5 % IV SOLN
2.0000 g | INTRAVENOUS | Status: AC
  Administered 2015-11-29: 2 g via INTRAVENOUS
  Filled 2015-11-28: qty 2

## 2015-11-28 NOTE — Anesthesia Preprocedure Evaluation (Signed)
Anesthesia Evaluation  Patient identified by MRN, date of birth, ID band Patient awake    Reviewed: Allergy & Precautions, H&P , Patient's Chart, lab work & pertinent test results, reviewed documented beta blocker date and time   Airway Mallampati: II  TM Distance: >3 FB Neck ROM: full    Dental no notable dental hx.    Pulmonary    Pulmonary exam normal breath sounds clear to auscultation       Cardiovascular  Rhythm:regular Rate:Normal     Neuro/Psych    GI/Hepatic   Endo/Other    Renal/GU      Musculoskeletal   Abdominal   Peds  Hematology  (+) anemia ,   Anesthesia Other Findings anemia  Reproductive/Obstetrics                             Anesthesia Physical Anesthesia Plan  ASA: II  Anesthesia Plan: General   Post-op Pain Management:    Induction: Intravenous  Airway Management Planned: Oral ETT  Additional Equipment:   Intra-op Plan:   Post-operative Plan: Extubation in OR  Informed Consent: I have reviewed the patients History and Physical, chart, labs and discussed the procedure including the risks, benefits and alternatives for the proposed anesthesia with the patient or authorized representative who has indicated his/her understanding and acceptance.   Dental Advisory Given and Dental advisory given  Plan Discussed with: CRNA and Surgeon  Anesthesia Plan Comments: (  Discussed general anesthesia, including possible nausea, instrumentation of airway, sore throat,pulmonary aspiration, etc. I asked if the were any outstanding questions, or  concerns before we proceeded. )        Anesthesia Quick Evaluation

## 2015-11-29 ENCOUNTER — Encounter (HOSPITAL_COMMUNITY): Payer: Self-pay | Admitting: Anesthesiology

## 2015-11-29 ENCOUNTER — Encounter (HOSPITAL_COMMUNITY)
Admission: RE | Disposition: A | Discharge status: Home / Self Care | Payer: Self-pay | Source: Ambulatory Visit | Attending: Obstetrics and Gynecology

## 2015-11-29 ENCOUNTER — Inpatient Hospital Stay (HOSPITAL_COMMUNITY): Payer: BLUE CROSS/BLUE SHIELD | Admitting: Anesthesiology

## 2015-11-29 ENCOUNTER — Inpatient Hospital Stay (HOSPITAL_COMMUNITY)
Admission: RE | Admit: 2015-11-29 | Discharge: 2015-11-30 | DRG: 743 | Disposition: A | Discharge status: Home / Self Care | Payer: BLUE CROSS/BLUE SHIELD | Source: Ambulatory Visit | Attending: Obstetrics and Gynecology | Admitting: Obstetrics and Gynecology

## 2015-11-29 DIAGNOSIS — D259 Leiomyoma of uterus, unspecified: Secondary | ICD-10-CM | POA: Diagnosis present

## 2015-11-29 DIAGNOSIS — D219 Benign neoplasm of connective and other soft tissue, unspecified: Secondary | ICD-10-CM | POA: Diagnosis present

## 2015-11-29 DIAGNOSIS — G9389 Other specified disorders of brain: Secondary | ICD-10-CM | POA: Diagnosis present

## 2015-11-29 DIAGNOSIS — D649 Anemia, unspecified: Secondary | ICD-10-CM | POA: Diagnosis present

## 2015-11-29 DIAGNOSIS — N920 Excessive and frequent menstruation with regular cycle: Secondary | ICD-10-CM | POA: Diagnosis present

## 2015-11-29 DIAGNOSIS — R0689 Other abnormalities of breathing: Secondary | ICD-10-CM | POA: Diagnosis present

## 2015-11-29 HISTORY — PX: BILATERAL SALPINGECTOMY: SHX5743

## 2015-11-29 HISTORY — PX: ABDOMINAL HYSTERECTOMY: SHX81

## 2015-11-29 LAB — TYPE AND SCREEN
ABO/RH(D): A NEG
Antibody Screen: NEGATIVE

## 2015-11-29 LAB — PREGNANCY, URINE: Preg Test, Ur: NEGATIVE

## 2015-11-29 LAB — ABO/RH: ABO/RH(D): A NEG

## 2015-11-29 SURGERY — HYSTERECTOMY, ABDOMINAL
Anesthesia: General | Site: Abdomen | Laterality: Bilateral

## 2015-11-29 MED ORDER — ONDANSETRON HCL 4 MG/2ML IJ SOLN
INTRAMUSCULAR | Status: AC
  Filled 2015-11-29: qty 2

## 2015-11-29 MED ORDER — ONDANSETRON HCL 4 MG/2ML IJ SOLN
4.0000 mg | Freq: Four times a day (QID) | INTRAMUSCULAR | Status: DC | PRN
  Filled 2015-11-29: qty 2

## 2015-11-29 MED ORDER — MIDAZOLAM HCL 2 MG/2ML IJ SOLN
INTRAMUSCULAR | Status: DC | PRN
  Administered 2015-11-29: 2 mg via INTRAVENOUS

## 2015-11-29 MED ORDER — BUTORPHANOL TARTRATE 1 MG/ML IJ SOLN: 1.0000 mg | INTRAMUSCULAR | Status: DC | PRN

## 2015-11-29 MED ORDER — FENTANYL CITRATE (PF) 100 MCG/2ML IJ SOLN
INTRAMUSCULAR | Status: DC | PRN
  Administered 2015-11-29 (×4): 50 ug via INTRAVENOUS

## 2015-11-29 MED ORDER — HYDROMORPHONE HCL 1 MG/ML IJ SOLN
INTRAMUSCULAR | Status: AC
  Administered 2015-11-29: 0.5 mg via INTRAVENOUS
  Filled 2015-11-29: qty 1

## 2015-11-29 MED ORDER — SODIUM CHLORIDE 0.9 % IJ SOLN
INTRAMUSCULAR | Status: DC | PRN
  Administered 2015-11-29: 50 mL via INTRAVENOUS

## 2015-11-29 MED ORDER — NEOSTIGMINE METHYLSULFATE 10 MG/10ML IV SOLN
INTRAVENOUS | Status: DC | PRN
  Administered 2015-11-29: 4 mg via INTRAVENOUS

## 2015-11-29 MED ORDER — KETOROLAC TROMETHAMINE 30 MG/ML IJ SOLN
INTRAMUSCULAR | Status: AC
  Filled 2015-11-29: qty 1

## 2015-11-29 MED ORDER — PROPOFOL 10 MG/ML IV BOLUS
INTRAVENOUS | Status: AC
  Filled 2015-11-29: qty 20

## 2015-11-29 MED ORDER — MORPHINE SULFATE 2 MG/ML IV SOLN
INTRAVENOUS | Status: DC
  Administered 2015-11-29: 3 mg via INTRAVENOUS
  Administered 2015-11-29: 12:00:00 via INTRAVENOUS
  Administered 2015-11-29: 1 mg via INTRAVENOUS
  Administered 2015-11-30: 0 mg via INTRAVENOUS
  Administered 2015-11-30: 1 mg via INTRAVENOUS
  Filled 2015-11-29: qty 25

## 2015-11-29 MED ORDER — LIDOCAINE HCL (CARDIAC) 20 MG/ML IV SOLN
INTRAVENOUS | Status: DC | PRN
  Administered 2015-11-29: 70 mg via INTRAVENOUS
  Administered 2015-11-29: 30 mg via INTRAVENOUS

## 2015-11-29 MED ORDER — SODIUM CHLORIDE 0.9 % IJ SOLN
INTRAMUSCULAR | Status: AC
  Filled 2015-11-29: qty 20

## 2015-11-29 MED ORDER — ONDANSETRON HCL 4 MG/2ML IJ SOLN
INTRAMUSCULAR | Status: DC | PRN
  Administered 2015-11-29: 4 mg via INTRAVENOUS

## 2015-11-29 MED ORDER — CEFAZOLIN SODIUM-DEXTROSE 2-3 GM-% IV SOLR
INTRAVENOUS | Status: AC
  Filled 2015-11-29: qty 50

## 2015-11-29 MED ORDER — ONDANSETRON HCL 4 MG/2ML IJ SOLN
4.0000 mg | Freq: Four times a day (QID) | INTRAMUSCULAR | Status: DC | PRN
  Administered 2015-11-29: 4 mg via INTRAVENOUS

## 2015-11-29 MED ORDER — DEXTROSE IN LACTATED RINGERS 5 % IV SOLN
INTRAVENOUS | Status: DC
  Administered 2015-11-29 – 2015-11-30 (×3): via INTRAVENOUS

## 2015-11-29 MED ORDER — KETOROLAC TROMETHAMINE 30 MG/ML IJ SOLN: 30.0000 mg | Freq: Once | INTRAMUSCULAR | Status: DC

## 2015-11-29 MED ORDER — LACTATED RINGERS IV SOLN
INTRAVENOUS | Status: DC
  Administered 2015-11-29 (×2): via INTRAVENOUS

## 2015-11-29 MED ORDER — GLYCOPYRROLATE 0.2 MG/ML IJ SOLN
INTRAMUSCULAR | Status: AC
  Filled 2015-11-29: qty 3

## 2015-11-29 MED ORDER — ACETAMINOPHEN 160 MG/5ML PO SOLN
ORAL | Status: AC
  Filled 2015-11-29: qty 40.6

## 2015-11-29 MED ORDER — LIDOCAINE HCL (CARDIAC) 20 MG/ML IV SOLN
INTRAVENOUS | Status: AC
  Filled 2015-11-29: qty 5

## 2015-11-29 MED ORDER — DIPHENHYDRAMINE HCL 50 MG/ML IJ SOLN
12.5000 mg | Freq: Four times a day (QID) | INTRAMUSCULAR | Status: DC | PRN
Start: 2015-11-29 — End: 2015-11-30

## 2015-11-29 MED ORDER — DIPHENHYDRAMINE HCL 12.5 MG/5ML PO ELIX
12.5000 mg | ORAL_SOLUTION | Freq: Four times a day (QID) | ORAL | Status: DC | PRN
  Filled 2015-11-29: qty 5

## 2015-11-29 MED ORDER — KETOROLAC TROMETHAMINE 30 MG/ML IJ SOLN
30.0000 mg | Freq: Four times a day (QID) | INTRAMUSCULAR | Status: DC
Start: 2015-11-29 — End: 2015-11-30
  Administered 2015-11-29 – 2015-11-30 (×4): 30 mg via INTRAVENOUS
  Filled 2015-11-29 (×4): qty 1

## 2015-11-29 MED ORDER — HYDROMORPHONE HCL 1 MG/ML IJ SOLN
0.2500 mg | INTRAMUSCULAR | Status: DC | PRN
  Administered 2015-11-29 (×4): 0.5 mg via INTRAVENOUS

## 2015-11-29 MED ORDER — SODIUM CHLORIDE 0.9 % IJ SOLN
INTRAMUSCULAR | Status: AC
  Filled 2015-11-29: qty 100

## 2015-11-29 MED ORDER — FENTANYL CITRATE (PF) 250 MCG/5ML IJ SOLN
INTRAMUSCULAR | Status: AC
  Filled 2015-11-29: qty 5

## 2015-11-29 MED ORDER — DEXAMETHASONE SODIUM PHOSPHATE 4 MG/ML IJ SOLN
INTRAMUSCULAR | Status: AC
  Filled 2015-11-29: qty 1

## 2015-11-29 MED ORDER — SCOPOLAMINE 1 MG/3DAYS TD PT72
MEDICATED_PATCH | TRANSDERMAL | Status: AC
  Filled 2015-11-29: qty 1

## 2015-11-29 MED ORDER — MENTHOL 3 MG MT LOZG
1.0000 | LOZENGE | OROMUCOSAL | Status: DC | PRN
  Administered 2015-11-30: 3 mg via ORAL
  Filled 2015-11-29: qty 9

## 2015-11-29 MED ORDER — FLUMAZENIL 0.5 MG/5ML IV SOLN
INTRAVENOUS | Status: DC | PRN
  Administered 2015-11-29: 0.2 mg via INTRAVENOUS

## 2015-11-29 MED ORDER — GLYCOPYRROLATE 0.2 MG/ML IJ SOLN
INTRAMUSCULAR | Status: DC | PRN
  Administered 2015-11-29: 0.6 mg via INTRAVENOUS

## 2015-11-29 MED ORDER — NALOXONE HCL 0.4 MG/ML IJ SOLN: 0.4000 mg | INTRAMUSCULAR | Status: DC | PRN

## 2015-11-29 MED ORDER — BUPIVACAINE LIPOSOME 1.3 % IJ SUSP
20.0000 mL | Freq: Once | INTRAMUSCULAR | Status: AC
  Administered 2015-11-29: 50 mL
  Filled 2015-11-29: qty 20

## 2015-11-29 MED ORDER — ROCURONIUM BROMIDE 100 MG/10ML IV SOLN
INTRAVENOUS | Status: DC | PRN
  Administered 2015-11-29: 40 mg via INTRAVENOUS

## 2015-11-29 MED ORDER — DEXAMETHASONE SODIUM PHOSPHATE 10 MG/ML IJ SOLN
INTRAMUSCULAR | Status: DC | PRN
  Administered 2015-11-29: 4 mg via INTRAVENOUS

## 2015-11-29 MED ORDER — KETOROLAC TROMETHAMINE 30 MG/ML IJ SOLN: 30.0000 mg | Freq: Four times a day (QID) | INTRAMUSCULAR | Status: DC

## 2015-11-29 MED ORDER — OXYCODONE-ACETAMINOPHEN 5-325 MG PO TABS: 1.0000 | ORAL_TABLET | ORAL | Status: DC | PRN

## 2015-11-29 MED ORDER — ACETAMINOPHEN 160 MG/5ML PO SOLN
975.0000 mg | Freq: Once | ORAL | Status: AC
  Administered 2015-11-29: 975 mg via ORAL

## 2015-11-29 MED ORDER — KETOROLAC TROMETHAMINE 30 MG/ML IJ SOLN
INTRAMUSCULAR | Status: DC | PRN
  Administered 2015-11-29: 30 mg via INTRAVENOUS

## 2015-11-29 MED ORDER — SCOPOLAMINE 1 MG/3DAYS TD PT72
1.0000 | MEDICATED_PATCH | Freq: Once | TRANSDERMAL | Status: DC
  Administered 2015-11-29: 1.5 mg via TRANSDERMAL

## 2015-11-29 MED ORDER — ROCURONIUM BROMIDE 100 MG/10ML IV SOLN
INTRAVENOUS | Status: AC
  Filled 2015-11-29: qty 1

## 2015-11-29 MED ORDER — INFLUENZA VAC SPLIT QUAD 0.5 ML IM SUSY
0.5000 mL | PREFILLED_SYRINGE | INTRAMUSCULAR | Status: AC
  Administered 2015-11-30: 0.5 mL via INTRAMUSCULAR
  Filled 2015-11-29: qty 0.5

## 2015-11-29 MED ORDER — IBUPROFEN 800 MG PO TABS
800.0000 mg | ORAL_TABLET | Freq: Three times a day (TID) | ORAL | Status: DC | PRN
  Administered 2015-11-30: 800 mg via ORAL
  Filled 2015-11-29: qty 1

## 2015-11-29 MED ORDER — FLUMAZENIL 0.5 MG/5ML IV SOLN
INTRAVENOUS | Status: AC
  Filled 2015-11-29: qty 5

## 2015-11-29 MED ORDER — ONDANSETRON HCL 4 MG PO TABS: 4.0000 mg | ORAL_TABLET | Freq: Four times a day (QID) | ORAL | Status: DC | PRN

## 2015-11-29 MED ORDER — PROPOFOL 10 MG/ML IV BOLUS
INTRAVENOUS | Status: DC | PRN
  Administered 2015-11-29: 170 mg via INTRAVENOUS

## 2015-11-29 MED ORDER — NEOSTIGMINE METHYLSULFATE 10 MG/10ML IV SOLN
INTRAVENOUS | Status: AC
  Filled 2015-11-29: qty 1

## 2015-11-29 MED ORDER — SODIUM CHLORIDE 0.9 % IJ SOLN: 9.0000 mL | INTRAMUSCULAR | Status: DC | PRN

## 2015-11-29 MED ORDER — MIDAZOLAM HCL 2 MG/2ML IJ SOLN
INTRAMUSCULAR | Status: AC
  Filled 2015-11-29: qty 2

## 2015-11-29 SURGICAL SUPPLY — 44 items
APL SKNCLS STERI-STRIP NONHPOA (GAUZE/BANDAGES/DRESSINGS) ×1
BARRIER ADHS 3X4 INTERCEED (GAUZE/BANDAGES/DRESSINGS) IMPLANT
BENZOIN TINCTURE PRP APPL 2/3 (GAUZE/BANDAGES/DRESSINGS) ×2 IMPLANT
BRR ADH 4X3 ABS CNTRL BYND (GAUZE/BANDAGES/DRESSINGS)
CANISTER SUCT 3000ML (MISCELLANEOUS) ×2 IMPLANT
CLOTH BEACON ORANGE TIMEOUT ST (SAFETY) ×2 IMPLANT
CONT PATH 16OZ SNAP LID 3702 (MISCELLANEOUS) ×2 IMPLANT
DECANTER SPIKE VIAL GLASS SM (MISCELLANEOUS) ×1 IMPLANT
DRAPE CESAREAN BIRTH W POUCH (DRAPES) ×2 IMPLANT
DRAPE WARM FLUID 44X44 (DRAPE) ×2 IMPLANT
DRSG OPSITE POSTOP 4X10 (GAUZE/BANDAGES/DRESSINGS) ×2 IMPLANT
DURAPREP 26ML APPLICATOR (WOUND CARE) ×2 IMPLANT
ELECT LIGASURE SHORT 9 REUSE (ELECTRODE) ×1 IMPLANT
GAUZE SPONGE 4X4 16PLY XRAY LF (GAUZE/BANDAGES/DRESSINGS) ×1 IMPLANT
GLOVE BIO SURGEON STRL SZ7 (GLOVE) ×4 IMPLANT
GLOVE BIOGEL PI IND STRL 7.0 (GLOVE) ×3 IMPLANT
GLOVE BIOGEL PI INDICATOR 7.0 (GLOVE) ×3
GOWN STRL REUS W/TWL LRG LVL3 (GOWN DISPOSABLE) ×6 IMPLANT
NDL HYPO 18GX1.5 BLUNT FILL (NEEDLE) IMPLANT
NEEDLE HYPO 18GX1.5 BLUNT FILL (NEEDLE) IMPLANT
NEEDLE HYPO 22GX1.5 SAFETY (NEEDLE) ×2 IMPLANT
NS IRRIG 1000ML POUR BTL (IV SOLUTION) ×3 IMPLANT
PACK ABDOMINAL GYN (CUSTOM PROCEDURE TRAY) ×2 IMPLANT
PAD OB MATERNITY 4.3X12.25 (PERSONAL CARE ITEMS) ×2 IMPLANT
PENCIL SMOKE EVAC W/HOLSTER (ELECTROSURGICAL) ×1 IMPLANT
SPONGE LAP 18X18 X RAY DECT (DISPOSABLE) ×3 IMPLANT
STRIP CLOSURE SKIN 1/2X4 (GAUZE/BANDAGES/DRESSINGS) ×2 IMPLANT
SUT CHROMIC 3 0 SH 27 (SUTURE) IMPLANT
SUT MON AB 2-0 CT1 36 (SUTURE) ×2 IMPLANT
SUT MON AB 4-0 PS1 27 (SUTURE) ×2 IMPLANT
SUT PDS AB 0 CT1 27 (SUTURE) ×4 IMPLANT
SUT VIC AB 0 CT1 18XCR BRD8 (SUTURE) ×2 IMPLANT
SUT VIC AB 0 CT1 27 (SUTURE) ×2
SUT VIC AB 0 CT1 27XBRD ANBCTR (SUTURE) ×1 IMPLANT
SUT VIC AB 0 CT1 8-18 (SUTURE) ×4
SUT VIC AB 2-0 CT1 27 (SUTURE)
SUT VIC AB 2-0 CT1 TAPERPNT 27 (SUTURE) IMPLANT
SUT VIC AB 3-0 CT1 27 (SUTURE) ×2
SUT VIC AB 3-0 CT1 TAPERPNT 27 (SUTURE) ×1 IMPLANT
SUT VICRYL 0 TIES 12 18 (SUTURE) ×2 IMPLANT
SYR 20CC LL (SYRINGE) ×3 IMPLANT
TOWEL OR 17X24 6PK STRL BLUE (TOWEL DISPOSABLE) ×4 IMPLANT
TRAY FOLEY CATH SILVER 14FR (SET/KITS/TRAYS/PACK) ×2 IMPLANT
WATER STERILE IRR 1000ML POUR (IV SOLUTION) ×1 IMPLANT

## 2015-11-29 NOTE — Progress Notes (Signed)
eLink Physician-Brief Progress Note Patient Name: [name] DOB: 02-01-1966 MRN: [id number]   Date of Service  11/29/2015  HPI/Events of Note  New patient s/p TAH w/ BSO. Dyspnea post extubation in PACU & admitted to ICU for close monitoring. Camera check shows patient breathing comfortably in no distress. Vitals stable.  eICU Interventions  Continue current plant of care. IS at bedside.     Intervention Category Evaluation Type: New Patient Evaluation  Tera Partridge 11/29/2015, 10:36 PM

## 2015-11-29 NOTE — Progress Notes (Signed)
The patient was re-examined with no change in status, pre op Hgb 9.8 

## 2015-11-29 NOTE — Transfer of Care (Signed)
Immediate Anesthesia Transfer of Care Note  Patient: Penny Ward  Procedure(s) Performed: Procedure(s): HYSTERECTOMY ABDOMINAL (Bilateral) BILATERAL SALPINGECTOMY (Bilateral)  Patient Location: PACU  Anesthesia Type:General  Level of Consciousness: awake, alert , oriented and patient cooperative  Airway & Oxygen Therapy: Patient Spontanous Breathing and Patient connected to nasal cannula oxygen  Post-op Assessment: Report given to RN and Post -op Vital signs reviewed and stable  Post vital signs: Reviewed and stable  Last Vitals:  Filed Vitals:   11/29/15 0621 11/29/15 0635  BP:  143/93  Pulse: 86   Temp: 36.7 C   Resp: 20     Complications: No apparent anesthesia complications

## 2015-11-29 NOTE — Anesthesia Procedure Notes (Signed)
Procedure Name: Intubation Date/Time: 11/29/2015 7:17 AM Performed by: Tobin Chad Pre-anesthesia Checklist: Patient identified, Timeout performed, Emergency Drugs available, Suction available and Patient being monitored Patient Re-evaluated:Patient Re-evaluated prior to inductionOxygen Delivery Method: Circle system utilized and Simple face mask Preoxygenation: Pre-oxygenation with 100% oxygen Intubation Type: IV induction and Inhalational induction Ventilation: Mask ventilation without difficulty Laryngoscope Size: Mac and 3 Grade View: Grade II Tube type: Oral Tube size: 7.0 mm Number of attempts: 1 Airway Equipment and Method: Stylet Placement Confirmation: ETT inserted through vocal cords under direct vision,  positive ETCO2 and breath sounds checked- equal and bilateral Secured at: 21 cm Tube secured with: Tape Dental Injury: Teeth and Oropharynx as per pre-operative assessment

## 2015-11-29 NOTE — Op Note (Signed)
Preoperative diagnosis: Symptomatic leiomyoma  Postoperative diagnosis: Same  Procedure: TAH, bilateral salpingectomy  Surgeon: Matthew Saras  Asst.: Morris  EBL: 1 50 cc  Procedure and findings:  The patient was taken the operating room after an adequate level of general anesthesia was obtained with the patient supine the abdomen prepped and draped in the usual fashion, vagina was prepped separately, Foley catheter positioned. Appropriate timeouts were taken at that point. After prepping and draping, transverse incision made 2 finger breaths above the symphysis carried down to the fascia which was incised and extended transversely. Rectus muscle divided in the midline, peritoneum entered superiorly without incident and extended in a vertical fashion. Multiple fibroid uterus approximately 15 week size was noted was elevated through the incision retractor was not used. Bilateral tubes and ovaries appeared to be normal. No other abnormalities were noted. Starting on the left the LigaSure device was used to coagulate and divide the round ligament peritoneum carried around to the midline, LigaSure was then used to perform salpingectomy, the utero-ovarian pedicle was then isolated clamped divided first free tie followed by suture ligature of Vicryl same repeated on the opposite side removing both tubes, conserving both ovaries.  The bladder was dissected below with sharp and blunt dissection, uterine vasculature pedicles were then skeletonized coagulated and divided. In sequential manner staying close to the uterus, the cardinal ligament, uterosacral ligament and cervical vaginal pedicles were clamped divided and suture ligated with 0 Vicryl. Specimen was excised the cuff was then closed with interrupted 0 Vicryl sutures. The pelvis is in irrigated with saline and noted be hemostatic.  Prior to closure sponge, needle, history precast reported as correct 2. Peritoneum closed with a 3-0 Vicryl suture the same for  the rectus muscles in the midline. Diluted Exparel solution was then used to inject subfascially and subcutaneously for postoperative pain relief. Subcutaneous tissue was hemostatic was not closed separately 4-0 Monocryl subcuticular closure with a honeycomb dressing. She tolerated this well went to recovery room in good condition.  Dictated with [place].

## 2015-11-29 NOTE — Anesthesia Postprocedure Evaluation (Signed)
Anesthesia Post Note  Patient: EVIAN MERISIER  Procedure(s) Performed: Procedure(s) (LRB): HYSTERECTOMY ABDOMINAL (Bilateral) BILATERAL SALPINGECTOMY (Bilateral)  Patient location during evaluation: [facility] Anesthesia Type: General Level of consciousness: awake and alert Pain management: satisfactory to patient Vital Signs Assessment: post-procedure vital signs reviewed and stable Respiratory status: spontaneous breathing and respiratory function stable Cardiovascular status: stable Postop Assessment: adequate PO intake Anesthetic complications: no    Last Vitals:  Filed Vitals:   11/29/15 1210 11/29/15 1300  BP:  131/84  Pulse:  60  Temp:    Resp: 7 8    Last Pain:  Filed Vitals:   11/29/15 1306  PainSc: 4                  Eather Chaires

## 2015-11-29 NOTE — Anesthesia Postprocedure Evaluation (Signed)
Anesthesia Post Note  Patient: Penny Ward  Procedure(s) Performed: Procedure(s) (LRB): HYSTERECTOMY ABDOMINAL (Bilateral) BILATERAL SALPINGECTOMY (Bilateral)  Patient location during evaluation: PACU Anesthesia Type: General Level of consciousness: sedated and responds to stimulation Pain management: satisfactory to patient Vital Signs Assessment: vitals unstable Respiratory status: spontaneous breathing, respiratory function unstable and nonlabored ventilation Cardiovascular status: stable Anesthetic complications: no Comments: Pt needs close observation for Desat and apnea once pain is controlled. Discussed with [place] MD. Overnight in ICU, CO2 and Pulse ox monitors w/ PCA    Last Vitals:  Filed Vitals:   11/29/15 1045 11/29/15 1100  BP: 121/79 116/77  Pulse: 55 55  Temp:    Resp: 16 14    Last Pain:  Filed Vitals:   11/29/15 1113  PainSc: Asleep                 Ravenne Wayment EDWARD

## 2015-11-29 NOTE — Addendum Note (Signed)
Addendum  created 11/29/15 1322 by Flossie Dibble, CRNA   Modules edited: Notes Section   Notes Section:  File: [id number]

## 2015-11-30 ENCOUNTER — Encounter (HOSPITAL_COMMUNITY): Payer: Self-pay | Admitting: Obstetrics and Gynecology

## 2015-11-30 LAB — CBC
HCT: 23.2 % — ABNORMAL LOW (ref 36.0–46.0)
Hemoglobin: 7.4 g/dL — ABNORMAL LOW (ref 12.0–15.0)
MCH: 26.6 pg (ref 26.0–34.0)
MCHC: 31.9 g/dL (ref 30.0–36.0)
MCV: 83.5 fL (ref 78.0–100.0)
Platelets: 371 10*3/uL (ref 150–400)
RBC: 2.78 MIL/uL — ABNORMAL LOW (ref 3.87–5.11)
RDW: 14.4 % (ref 11.5–15.5)
WBC: 6.9 10*3/uL (ref 4.0–10.5)

## 2015-11-30 MED ORDER — IBUPROFEN 800 MG PO TABS
800.0000 mg | ORAL_TABLET | Freq: Three times a day (TID) | ORAL | Status: DC | PRN
Start: 2015-11-30 — End: 2018-01-26

## 2015-11-30 MED ORDER — OXYCODONE-ACETAMINOPHEN 5-325 MG PO TABS: 1.0000 | ORAL_TABLET | ORAL | Status: DC | PRN

## 2015-11-30 NOTE — Progress Notes (Signed)
Pt d/c home  Ambulated out teaching complete

## 2015-11-30 NOTE — Discharge Summary (Signed)
Physician Discharge Summary  Patient ID: Penny Ward MRN: [id number] DOB/AGE: 1966/07/02 50 y.o.  Admit date: 11/29/2015 Discharge date: 11/30/2015  Admission Diagnoses:Leiomyoma/menorrhagia/anemia  Discharge Diagnoses: same Active Problems:   Leiomyoma   Respiratory depression   Discharged Condition: good  Hospital Course: adm for TAH, bilat salpingectomy, on POD 1, afeb, tol PO, ambulating w/o prob  Consults: None  Significant Diagnostic Studies: labs:  Hemoglobin & Hematocrit     Component Value Date/Time   HGB 7.4* 11/30/2015 0615   HCT 23.2* 11/30/2015 0615     Treatments: surgery: TAH, bilat salpingectomy  Discharge Exam: Blood pressure 132/68, pulse 92, temperature 98.9 F (37.2 C), temperature source Oral, resp. rate 16, height 5' 6.5" (1.689 m), weight 182 lb (82.555 kg), SpO2 100 %. abd soft + BS, dressing dry  Disposition: 01-Home or Self Care     Medication List    STOP taking these medications        drospirenone-ethinyl estradiol 3-0.02 MG tablet  Commonly known as:  YAZ,GIANVI,LORYNA     Tdap 5-2.5-18.5 LF-MCG/0.5 injection  Commonly known as:  BOOSTRIX      TAKE these medications        FUSION 65-65-25-30 MG Caps  Take 1 tablet by mouth daily.     ibuprofen 800 MG tablet  Commonly known as:  ADVIL,MOTRIN  Take 1 tablet (800 mg total) by mouth every 8 (eight) hours as needed for moderate pain (mild pain).     oxyCODONE-acetaminophen 5-325 MG tablet  Commonly known as:  PERCOCET/ROXICET  Take 1-2 tablets by mouth every 4 (four) hours as needed for severe pain (moderate to severe pain (when tolerating fluids)).           Follow-up Information    Follow up with Margarette Asal, MD. Schedule an appointment as soon as possible for a visit in 7 days.   Specialty:  Obstetrics and Gynecology   Contact information:   [address] [place] [address] [phone]       Signed: Margarette Asal 11/30/2015, 12:47  PM

## 2015-11-30 NOTE — Progress Notes (Signed)
1 Day Post-Op Procedure(s) (LRB): HYSTERECTOMY ABDOMINAL (Bilateral) BILATERAL SALPINGECTOMY (Bilateral)  Subjective: Patient reports tolerating PO.    Objective: I have reviewed patient's vital signs and labs.  abd soft + BS, dressing dry Hemoglobin & Hematocrit     Component Value Date/Time   HGB 7.4* 11/30/2015 0615   HCT 23.2* 11/30/2015 0615    Assessment: s/p Procedure(s): HYSTERECTOMY ABDOMINAL (Bilateral) BILATERAL SALPINGECTOMY (Bilateral): stable  Plan: Advance diet Encourage ambulation Advance to PO medication Discontinue IV fluids  LOS: 1 day    Ihor Meinzer M 11/30/2015, 8:23 AM

## 2016-03-04 ENCOUNTER — Encounter: Payer: Self-pay | Admitting: Family

## 2018-01-26 ENCOUNTER — Encounter: Payer: Self-pay | Admitting: Nurse Practitioner

## 2018-01-26 ENCOUNTER — Ambulatory Visit: Payer: BLUE CROSS/BLUE SHIELD | Admitting: Nurse Practitioner

## 2018-01-26 VITALS — BP 146/94 | HR 82 | Temp 99.0°F | Ht 67.0 in | Wt 193.0 lb

## 2018-01-26 DIAGNOSIS — E785 Hyperlipidemia, unspecified: Secondary | ICD-10-CM

## 2018-01-26 DIAGNOSIS — I10 Essential (primary) hypertension: Secondary | ICD-10-CM

## 2018-01-26 MED ORDER — HYDROCHLOROTHIAZIDE 25 MG PO TABS: 25.0000 mg | ORAL_TABLET | Freq: Every day | ORAL | 3 refills | Status: DC

## 2018-01-26 NOTE — Patient Instructions (Addendum)
30 mins/5 days a week for cardiovascular health Change diet- limit sodium (see below)  Start lisinopril 10 mg daily  Follow up in 1 month (come fasting)  Take blood pressure- make sure this is after medication (1 hours) and sitting for at least 5 mins    DASH Eating Plan DASH stands for "Dietary Approaches to Stop Hypertension." The DASH eating plan is a healthy eating plan that has been shown to reduce high blood pressure (hypertension). It may also reduce your risk for type 2 diabetes, heart disease, and stroke. The DASH eating plan may also help with weight loss. What are tips for following this plan? General guidelines  Avoid eating more than 2,300 mg (milligrams) of salt (sodium) a day. If you have hypertension, you may need to reduce your sodium intake to 1,500 mg a day.  Limit alcohol intake to no more than 1 drink a day for nonpregnant women and 2 drinks a day for men. One drink equals 12 oz of beer, 5 oz of wine, or 1 oz of hard liquor.  Work with your health care provider to maintain a healthy body weight or to lose weight. Ask what an ideal weight is for you.  Get at least 30 minutes of exercise that causes your heart to beat faster (aerobic exercise) most days of the week. Activities may include walking, swimming, or biking.  Work with your health care provider or diet and nutrition specialist (dietitian) to adjust your eating plan to your individual calorie needs. Reading food labels  Check food labels for the amount of sodium per serving. Choose foods with less than 5 percent of the Daily Value of sodium. Generally, foods with less than 300 mg of sodium per serving fit into this eating plan.  To find whole grains, look for the word "whole" as the first word in the ingredient list. Shopping  Buy products labeled as "low-sodium" or "no salt added."  Buy fresh foods. Avoid canned foods and premade or frozen meals. Cooking  Avoid adding salt when cooking. Use salt-free  seasonings or herbs instead of table salt or sea salt. Check with your health care provider or pharmacist before using salt substitutes.  Do not fry foods. Cook foods using healthy methods such as baking, boiling, grilling, and broiling instead.  Cook with heart-healthy oils, such as olive, canola, soybean, or sunflower oil. Meal planning   Eat a balanced diet that includes: ? 5 or more servings of fruits and vegetables each day. At each meal, try to fill half of your plate with fruits and vegetables. ? Up to 6-8 servings of whole grains each day. ? Less than 6 oz of lean meat, poultry, or fish each day. A 3-oz serving of meat is about the same size as a deck of cards. One egg equals 1 oz. ? 2 servings of low-fat dairy each day. ? A serving of nuts, seeds, or beans 5 times each week. ? Heart-healthy fats. Healthy fats called Omega-3 fatty acids are found in foods such as flaxseeds and coldwater fish, like sardines, salmon, and mackerel.  Limit how much you eat of the following: ? Canned or prepackaged foods. ? Food that is high in trans fat, such as fried foods. ? Food that is high in saturated fat, such as fatty meat. ? Sweets, desserts, sugary drinks, and other foods with added sugar. ? Full-fat dairy products.  Do not salt foods before eating.  Try to eat at least 2 vegetarian meals each week.  Eat more home-cooked food and less restaurant, buffet, and fast food.  When eating at a restaurant, ask that your food be prepared with less salt or no salt, if possible. What foods are recommended? The items listed may not be a complete list. Talk with your dietitian about what dietary choices are best for you. Grains Whole-grain or whole-wheat bread. Whole-grain or whole-wheat pasta. [place] rice. [place]ena Morrow. Bulgur. Whole-grain and low-sodium cereals. Pita bread. Low-fat, low-sodium crackers. Whole-wheat flour tortillas. Vegetables Fresh or frozen vegetables (raw, steamed, roasted,  or grilled). Low-sodium or reduced-sodium tomato and vegetable juice. Low-sodium or reduced-sodium tomato sauce and tomato paste. Low-sodium or reduced-sodium canned vegetables. Fruits All fresh, dried, or frozen fruit. Canned fruit in natural juice (without added sugar). Meat and other protein foods Skinless chicken or [place]. Ground chicken or [place]. Pork with fat trimmed off. Fish and seafood. Egg whites. Dried beans, peas, or lentils. Unsalted nuts, nut butters, and seeds. Unsalted canned beans. Lean cuts of beef with fat trimmed off. Low-sodium, lean deli meat. Dairy Low-fat (1%) or fat-free (skim) milk. Fat-free, low-fat, or reduced-fat cheeses. Nonfat, low-sodium ricotta or cottage cheese. Low-fat or nonfat yogurt. Low-fat, low-sodium cheese. Fats and oils Soft margarine without trans fats. Vegetable oil. Low-fat, reduced-fat, or light mayonnaise and salad dressings (reduced-sodium). Canola, safflower, olive, soybean, and sunflower oils. Avocado. Seasoning and other foods Herbs. Spices. Seasoning mixes without salt. Unsalted popcorn and pretzels. Fat-free sweets. What foods are not recommended? The items listed may not be a complete list. Talk with your dietitian about what dietary choices are best for you. Grains Baked goods made with fat, such as croissants, muffins, or some breads. Dry pasta or rice meal packs. Vegetables Creamed or fried vegetables. Vegetables in a cheese sauce. Regular canned vegetables (not low-sodium or reduced-sodium). Regular canned tomato sauce and paste (not low-sodium or reduced-sodium). Regular tomato and vegetable juice (not low-sodium or reduced-sodium). Angie Fava. Olives. Fruits Canned fruit in a light or heavy syrup. Fried fruit. Fruit in cream or butter sauce. Meat and other protein foods Fatty cuts of meat. Ribs. Fried meat. Berniece Salines. Sausage. Bologna and other processed lunch meats. Salami. Fatback. Hotdogs. Bratwurst. Salted nuts and seeds. Canned beans  with added salt. Canned or smoked fish. Whole eggs or egg yolks. Chicken or [place] with skin. Dairy Whole or 2% milk, cream, and half-and-half. Whole or full-fat cream cheese. Whole-fat or sweetened yogurt. Full-fat cheese. Nondairy creamers. Whipped toppings. Processed cheese and cheese spreads. Fats and oils Butter. Stick margarine. Lard. Shortening. Ghee. Bacon fat. Tropical oils, such as coconut, palm kernel, or palm oil. Seasoning and other foods Salted popcorn and pretzels. Onion salt, garlic salt, seasoned salt, table salt, and sea salt. Worcestershire sauce. Tartar sauce. Barbecue sauce. Teriyaki sauce. Soy sauce, including reduced-sodium. Steak sauce. Canned and packaged gravies. Fish sauce. Oyster sauce. Cocktail sauce. Horseradish that you find on the shelf. Ketchup. Mustard. Meat flavorings and tenderizers. Bouillon cubes. Hot sauce and Tabasco sauce. Premade or packaged marinades. Premade or packaged taco seasonings. Relishes. Regular salad dressings. Where to find more information:  National Heart, Lung, and [place]: https://wilson-eaton.com/  American Heart Association: www.heart.org Summary  The DASH eating plan is a healthy eating plan that has been shown to reduce high blood pressure (hypertension). It may also reduce your risk for type 2 diabetes, heart disease, and stroke.  With the DASH eating plan, you should limit salt (sodium) intake to 2,300 mg a day. If you have hypertension, you may need to reduce your sodium intake  to 1,500 mg a day.  When on the DASH eating plan, aim to eat more fresh fruits and vegetables, whole grains, lean proteins, low-fat dairy, and heart-healthy fats.  Work with your health care provider or diet and nutrition specialist (dietitian) to adjust your eating plan to your individual calorie needs. This information is not intended to replace advice given to you by your health care provider. Make sure you discuss any questions you have with your health  care provider. Document Released: 10/30/2011 Document Revised: 11/03/2016 Document Reviewed: 11/03/2016 Elsevier Interactive Patient Education  [employer].

## 2018-01-26 NOTE — Progress Notes (Signed)
Careteam: Patient Care Team: Gildardo Cranker, DO as PCP - General (Internal Medicine) [name], MD as Consulting Physician (Orthopedic Surgery) Molli Posey, MD as Consulting Physician (Obstetrics and Gynecology)  Advanced Directive information    No Known Allergies  Chief Complaint  Patient presents with  . Acute Visit    Pt is being seen due to having headaches off and on for the last several weeks. Pt reports that BP was elevated 154/90 at health screening on 12/29/17 and while at dentist 140/90 on 01/14/18.     HPI: Patient is a 52 y.o. female seen in the office today due to headache and high blood pressure. Has not been seen since 2016. Pt normally does not have trouble with high blood pressure and is very healthy. Turning 52 this year and feels like she will come in more often.   Headaches started in early feb. Never had these headaches before but noticed blood pressure was up when headaches started. They have been on and off and thought she needed to be seen for this.  Having a lot of stress due to family Denies blurred vision, chest pains, shortness of breath, palpitations.  No sinus pain or congestion.  Otherwise feels well. No weakness Review of Systems:  Review of Systems  Constitutional: Negative for chills, fever and weight loss.  Eyes: Negative.   Respiratory: Negative for cough, sputum production and shortness of breath.   Cardiovascular: Negative for chest pain, palpitations and leg swelling.       Ankles swell wears compression socks  Gastrointestinal: Negative for abdominal pain, constipation, diarrhea and heartburn.  Genitourinary: Negative for dysuria, frequency and urgency.  Skin: Negative.   Neurological: Positive for headaches. Negative for dizziness and weakness.  Psychiatric/Behavioral: Negative for depression and memory loss. The patient does not have insomnia.        Increase stress     Past Medical History:  Diagnosis Date  . Anemia      Past Surgical History:  Procedure Laterality Date  . ABDOMINAL HYSTERECTOMY Bilateral 11/29/2015   Procedure: HYSTERECTOMY ABDOMINAL;  Surgeon: Molli Posey, MD;  Location: [facility];  Service: Gynecology;  Laterality: Bilateral;  . BILATERAL SALPINGECTOMY Bilateral 11/29/2015   Procedure: BILATERAL SALPINGECTOMY;  Surgeon: Molli Posey, MD;  Location: [facility];  Service: Gynecology;  Laterality: Bilateral;  . BREAST SURGERY  2007   reduction, Cristine Polio, MD  . VAGINAL DELIVERY    . WISDOM TOOTH EXTRACTION Bilateral age - early 76s   all 36 removed   Social History:   reports that  has never smoked. she has never used smokeless tobacco. She reports that she does not drink alcohol or use drugs.  Family History  Problem Relation Age of Onset  . Dementia Mother   . Hypertension Mother   . Hyperlipidemia Mother   . Arthritis Father   . Fibromyalgia Sister   . Migraines Sister   . Hypertension Sister   . Arthritis Sister   . Heart murmur Sister   . Bipolar disorder Sister   . Heart attack Maternal Grandfather   . Stroke Maternal Grandmother   . Cancer Maternal Grandmother   . Arthritis Maternal Grandmother   . Cancer Paternal Grandmother     Medications: Patient's Medications  New Prescriptions   No medications on file  Previous Medications   IBUPROFEN (ADVIL,MOTRIN) 200 MG TABLET    Take 200 mg by mouth every 6 (six) hours as needed.  Modified Medications   No medications on file  Discontinued Medications   FE FUM-FE POLY-VIT C-LACTOBAC (FUSION) 65-65-25-30 MG CAPS    Take 1 tablet by mouth daily.    IBUPROFEN (ADVIL,MOTRIN) 800 MG TABLET    Take 1 tablet (800 mg total) by mouth every 8 (eight) hours as needed for moderate pain (mild pain).   OXYCODONE-ACETAMINOPHEN (PERCOCET/ROXICET) 5-325 MG TABLET    Take 1-2 tablets by mouth every 4 (four) hours as needed for severe pain (moderate to severe pain (when tolerating fluids)).     Physical Exam:  Vitals:    01/26/18 1549  BP: (!) 146/94  Pulse: 82  Temp: 99 F (37.2 C)  TempSrc: Oral  SpO2: 99%  Weight: 193 lb (87.5 kg)  Height: 5\' 7"  (1.702 m)   Body mass index is 30.23 kg/m.  Physical Exam  Constitutional: She is oriented to person, place, and time. She appears well-developed and well-nourished. No distress.  HENT:  Head: Normocephalic and atraumatic.  Mouth/Throat: Oropharynx is clear and moist. No oropharyngeal exudate.  Eyes: Conjunctivae are normal. Pupils are equal, round, and reactive to light.  Neck: Normal range of motion. Neck supple.  Cardiovascular: Normal rate, regular rhythm and normal heart sounds.  Pulmonary/Chest: Effort normal and breath sounds normal.  Musculoskeletal: She exhibits no edema or tenderness.  Neurological: She is alert and oriented to person, place, and time.  Skin: Skin is warm and dry. She is not diaphoretic.  Psychiatric: She has a normal mood and affect.    Labs reviewed: Basic Metabolic Panel: No results for input(s): NA, K, CL, CO2, GLUCOSE, BUN, CREATININE, CALCIUM, MG, PHOS, TSH in the last 8760 hours. Liver Function Tests: No results for input(s): AST, ALT, ALKPHOS, BILITOT, PROT, ALBUMIN in the last 8760 hours. No results for input(s): LIPASE, AMYLASE in the last 8760 hours. No results for input(s): AMMONIA in the last 8760 hours. CBC: No results for input(s): WBC, NEUTROABS, HGB, HCT, MCV, PLT in the last 8760 hours. Lipid Panel: No results for input(s): CHOL, HDL, LDLCALC, TRIG, CHOLHDL, LDLDIRECT in the last 8760 hours. TSH: No results for input(s): TSH in the last 8760 hours. A1C: No results found for: HGBA1C   Assessment/Plan 1. Essential hypertension -4  Readings of elevated blood pressure.  Discussed dietary modifications, dash diet given -will start HCTZ 25 mg daily at this time -to take bp at home to monitor  - COMPLETE METABOLIC PANEL WITH GFR - CBC with Differential/Platelets - hydrochlorothiazide (HYDRODIURIL)  25 MG tablet; Take 1 tablet (25 mg total) by mouth daily.  Dispense: 30 tablet; Refill: 3  2. Hyperlipidemia LDL goal <100 -LDL elevated on last labs, encouraged lifestyle modifications Will follow up fasting labs at next OV   Next appt: follow up in 4 weeks Jessica K. Harle Battiest  [facility] [phone] 8 am - 5 pm) [phone] (after hours)

## 2018-01-27 LAB — CBC WITH DIFFERENTIAL/PLATELET
Basophils Absolute: 28 cells/uL (ref 0–200)
Basophils Relative: 0.5 %
Eosinophils Absolute: 62 cells/uL (ref 15–500)
Eosinophils Relative: 1.1 %
HCT: 39.8 % (ref 35.0–45.0)
Hemoglobin: 13.7 g/dL (ref 11.7–15.5)
Lymphs Abs: 1988 cells/uL (ref 850–3900)
MCH: 31.1 pg (ref 27.0–33.0)
MCHC: 34.4 g/dL (ref 32.0–36.0)
MCV: 90.5 fL (ref 80.0–100.0)
MPV: 9.1 fL (ref 7.5–12.5)
Monocytes Relative: 4.4 %
Neutro Abs: 3276 cells/uL (ref 1500–7800)
Neutrophils Relative %: 58.5 %
Platelets: 450 10*3/uL — ABNORMAL HIGH (ref 140–400)
RBC: 4.4 10*6/uL (ref 3.80–5.10)
RDW: 11.9 % (ref 11.0–15.0)
Total Lymphocyte: 35.5 %
WBC mixed population: 246 cells/uL (ref 200–950)
WBC: 5.6 10*3/uL (ref 3.8–10.8)

## 2018-01-27 LAB — COMPLETE METABOLIC PANEL WITH GFR
AG Ratio: 1.4 (calc) (ref 1.0–2.5)
ALT: 14 U/L (ref 6–29)
AST: 13 U/L (ref 10–35)
Albumin: 4.2 g/dL (ref 3.6–5.1)
Alkaline phosphatase (APISO): 52 U/L (ref 33–115)
BUN: 17 mg/dL (ref 7–25)
CO2: 27 mmol/L (ref 20–32)
Calcium: 9 mg/dL (ref 8.6–10.2)
Chloride: 105 mmol/L (ref 98–110)
Creat: 0.94 mg/dL (ref 0.50–1.10)
GFR, Est African American: 83 mL/min/{1.73_m2} (ref >=60)
GFR, Est Non African American: 71 mL/min/{1.73_m2} (ref >=60)
Globulin: 2.9 g/dL (calc) (ref 1.9–3.7)
Glucose, Bld: 108 mg/dL (ref 65–139)
Potassium: 4.1 mmol/L (ref 3.5–5.3)
Sodium: 139 mmol/L (ref 135–146)
Total Bilirubin: 0.5 mg/dL (ref 0.2–1.2)
Total Protein: 7.1 g/dL (ref 6.1–8.1)

## 2018-03-16 ENCOUNTER — Ambulatory Visit: Payer: BLUE CROSS/BLUE SHIELD | Admitting: Internal Medicine

## 2018-03-16 ENCOUNTER — Encounter: Payer: Self-pay | Admitting: Internal Medicine

## 2018-03-16 VITALS — BP 120/78 | HR 77 | Temp 98.4°F | Ht 67.0 in | Wt 188.4 lb

## 2018-03-16 DIAGNOSIS — I10 Essential (primary) hypertension: Secondary | ICD-10-CM | POA: Diagnosis not present

## 2018-03-16 DIAGNOSIS — Z79899 Other long term (current) drug therapy: Secondary | ICD-10-CM | POA: Diagnosis not present

## 2018-03-16 DIAGNOSIS — E785 Hyperlipidemia, unspecified: Secondary | ICD-10-CM | POA: Diagnosis not present

## 2018-03-16 NOTE — Patient Instructions (Addendum)
START BABY ASPIRIN 81 MG DAILY TO KEEP BLOOD THINNED  Continue other medications as ordered  Follow up in 2-3 mos for CPE/ECG. Fasting labs prior to appt

## 2018-03-16 NOTE — Progress Notes (Signed)
Patient ID: Penny Ward, female   DOB: 1965-12-01, 52 y.o.   MRN: [id number]   Location:  [facility] OFFICE  Provider: DR Arletha Grippe  Code Status: FULL CODE Goals of Care:  Advanced Directives 11/29/2015  Does Patient Have a Medical Advance Directive? No  Would patient like information on creating a medical advance directive? No - patient declined information     Chief Complaint  Patient presents with  . Medical Management of Chronic Issues    1 month FU on HTN and Hyperlipidemia; Pt has no c/o  . Medication Refill    No Refills    HPI: Patient is a 52 y.o. female seen today for medical management of chronic diseases.  She is taking HCTZ daily as ordered.  Prior to med, SBP 140-150s. No HA, dizziness, CP, SOB. No increased urinary frequency. No leg cramps. She has b/l LE edema despite wearing compression stockings daily. She works from home at [employer]. Swelling resolves overnight and worsens as day progresses.   Past Medical History:  Diagnosis Date  . Anemia     Past Surgical History:  Procedure Laterality Date  . ABDOMINAL HYSTERECTOMY Bilateral 11/29/2015   Procedure: HYSTERECTOMY ABDOMINAL;  Surgeon: Molli Posey, MD;  Location: [facility];  Service: Gynecology;  Laterality: Bilateral;  . BILATERAL SALPINGECTOMY Bilateral 11/29/2015   Procedure: BILATERAL SALPINGECTOMY;  Surgeon: Molli Posey, MD;  Location: [facility];  Service: Gynecology;  Laterality: Bilateral;  . BREAST SURGERY  2007   reduction, Cristine Polio, MD  . VAGINAL DELIVERY    . WISDOM TOOTH EXTRACTION Bilateral age - early 73s   all 2 removed     reports that she has never smoked. She has never used smokeless tobacco. She reports that she does not drink alcohol or use drugs. Social History   Socioeconomic History  . Marital status: Married    Spouse name: Not on file  . Number of children: Not on file  . Years of education: Not on file  . Highest education level: Not on file  Occupational History  .  Not on file  Social Needs  . Financial resource strain: Not on file  . Food insecurity:    Worry: Not on file    Inability: Not on file  . Transportation needs:    Medical: Not on file    Non-medical: Not on file  Tobacco Use  . Smoking status: Never Smoker  . Smokeless tobacco: Never Used  Substance and Sexual Activity  . Alcohol use: No  . Drug use: No  . Sexual activity: Yes  Lifestyle  . Physical activity:    Days per week: Not on file    Minutes per session: Not on file  . Stress: Not on file  Relationships  . Social connections:    Talks on phone: Not on file    Gets together: Not on file    Attends religious service: Not on file    Active member of club or organization: Not on file    Attends meetings of clubs or organizations: Not on file    Relationship status: Not on file  . Intimate partner violence:    Fear of current or ex partner: Not on file    Emotionally abused: Not on file    Physically abused: Not on file    Forced sexual activity: Not on file  Other Topics Concern  . Not on file  Social History Narrative   Diet- None   Caffeine- Yes (coffee, soda  and tea)   Married- Yes 1997   House- 2 stories, 3 people   Pets- No   Current/Past profession- Marketing executive   Exercise- No   Living Will- No   DNR- No   POA/HPOA- No    Family History  Problem Relation Age of Onset  . Dementia Mother   . Hypertension Mother   . Hyperlipidemia Mother   . Arthritis Father   . Fibromyalgia Sister   . Migraines Sister   . Hypertension Sister   . Arthritis Sister   . Heart murmur Sister   . Bipolar disorder Sister   . Heart attack Maternal Grandfather   . Stroke Maternal Grandmother   . Cancer Maternal Grandmother   . Arthritis Maternal Grandmother   . Cancer Paternal Grandmother     No Known Allergies  Outpatient Encounter Medications as of 03/16/2018  Medication Sig  . hydrochlorothiazide (HYDRODIURIL) 25 MG tablet Take 1 tablet (25 mg total) by  mouth daily.  Marland Kitchen ibuprofen (ADVIL,MOTRIN) 200 MG tablet Take 200 mg by mouth every 6 (six) hours as needed.   No facility-administered encounter medications on file as of 03/16/2018.     Review of Systems:  Review of Systems  Cardiovascular: Positive for leg swelling.  All other systems reviewed and are negative.   Health Maintenance  Topic Date Due  . TETANUS/TDAP  07/19/1987  . HIV Screening  01/27/2019 (Originally 07/19/1983)  . INFLUENZA VACCINE  06/24/2018  . PAP SMEAR  Discontinued    Physical Exam: Vitals:   03/16/18 1539  BP: 120/78  Pulse: 77  Temp: 98.4 F (36.9 C)  TempSrc: Oral  SpO2: 98%  Weight: 188 lb 6.4 oz (85.5 kg)  Height: '5\' 7"'$  (1.702 m)   Body mass index is 29.51 kg/m. Physical Exam  Constitutional: She is oriented to person, place, and time. She appears well-developed and well-nourished.  HENT:  Mouth/Throat: Oropharynx is clear and moist. No oropharyngeal exudate.  Eyes: Pupils are equal, round, and reactive to light. No scleral icterus.  Neck: Neck supple. Carotid bruit is not present. No tracheal deviation present. No thyromegaly present.  Cardiovascular: Normal rate, regular rhythm, normal heart sounds and intact distal pulses. Exam reveals no gallop and no friction rub.  No murmur heard. Trace LE edema b/l. no calf TTP.   Pulmonary/Chest: Effort normal and breath sounds normal. No stridor. No respiratory distress. She has no wheezes. She has no rales.  Abdominal: Soft. Bowel sounds are normal. She exhibits no distension and no mass. There is no hepatomegaly. There is no tenderness. There is no rebound and no guarding.  Lymphadenopathy:    She has no cervical adenopathy.  Neurological: She is alert and oriented to person, place, and time. She has normal reflexes.  Skin: Skin is warm and dry. No rash noted.  Psychiatric: She has a normal mood and affect. Her behavior is normal. Judgment and thought content normal.    Labs reviewed: Basic  Metabolic Panel: Recent Labs    01/26/18 1613  NA 139  K 4.1  CL 105  CO2 27  GLUCOSE 108  BUN 17  CREATININE 0.94  CALCIUM 9.0   Liver Function Tests: Recent Labs    01/26/18 1613  AST 13  ALT 14  BILITOT 0.5  PROT 7.1   No results for input(s): LIPASE, AMYLASE in the last 8760 hours. No results for input(s): AMMONIA in the last 8760 hours. CBC: Recent Labs    01/26/18 1613  WBC 5.6  NEUTROABS  3,276  HGB 13.7  HCT 39.8  MCV 90.5  PLT 450*   Lipid Panel: No results for input(s): CHOL, HDL, LDLCALC, TRIG, CHOLHDL, LDLDIRECT in the last 8760 hours. No results found for: HGBA1C  Procedures since last visit: No results found.  Assessment/Plan     ICD-10-CM   1. Essential hypertension I10 TSH    BMP with eGFR([facility])  2. Hyperlipidemia LDL goal <100 E78.5 Lipid Panel  3. High risk medication use Z79.899 ALT    BMP with eGFR([facility])   START BABY ASPIRIN 81 MG DAILY TO KEEP BLOOD THINNED  Continue other medications as ordered  Follow up in 2-3 mos for CPE/ECG. Fasting labs prior to appt    [employer] S. Perlie Gold  [facility] [address] [place], [place] [address] [phone] Cell (Monday-Friday 8 AM - 5 PM) [phone] After 5 PM and follow prompts

## 2018-03-17 ENCOUNTER — Other Ambulatory Visit: Payer: Self-pay

## 2018-05-23 ENCOUNTER — Other Ambulatory Visit: Payer: Self-pay | Admitting: Nurse Practitioner

## 2018-05-23 DIAGNOSIS — I10 Essential (primary) hypertension: Secondary | ICD-10-CM

## 2018-06-04 ENCOUNTER — Encounter: Payer: BLUE CROSS/BLUE SHIELD | Admitting: Internal Medicine

## 2018-07-14 ENCOUNTER — Encounter: Payer: Self-pay | Admitting: Internal Medicine

## 2018-09-17 ENCOUNTER — Other Ambulatory Visit: Payer: Self-pay | Admitting: Nurse Practitioner

## 2018-09-17 DIAGNOSIS — I10 Essential (primary) hypertension: Secondary | ICD-10-CM

## 2019-01-14 ENCOUNTER — Ambulatory Visit (INDEPENDENT_AMBULATORY_CARE_PROVIDER_SITE_OTHER): Payer: 59 | Admitting: Family

## 2019-01-14 ENCOUNTER — Encounter: Payer: Self-pay | Admitting: Family

## 2019-01-14 VITALS — BP 118/80 | HR 75 | Temp 98.4°F | Ht 67.0 in | Wt 185.0 lb

## 2019-01-14 DIAGNOSIS — Z23 Encounter for immunization: Secondary | ICD-10-CM | POA: Diagnosis not present

## 2019-01-14 DIAGNOSIS — I10 Essential (primary) hypertension: Secondary | ICD-10-CM | POA: Diagnosis not present

## 2019-01-14 MED ORDER — TETANUS-DIPHTH-ACELL PERTUSSIS 5-2.5-18.5 LF-MCG/0.5 IM SUSP: 0.5000 mL | Freq: Once | INTRAMUSCULAR | 0 refills | Status: DC

## 2019-01-14 MED ORDER — HYDROCHLOROTHIAZIDE 25 MG PO TABS: 25.0000 mg | ORAL_TABLET | Freq: Every day | ORAL | 1 refills | Status: DC

## 2019-01-14 MED ORDER — TETANUS-DIPHTH-ACELL PERTUSSIS 5-2.5-18.5 LF-MCG/0.5 IM SUSP: 0.5000 mL | Freq: Once | INTRAMUSCULAR | 0 refills | Status: AC

## 2019-01-14 NOTE — Progress Notes (Signed)
Provider: Marlowe Sax FNP-C   Cathlin Buchan, Nelda Bucks, NP  Patient Care Team: Travarius Lange, Nelda Bucks, NP as PCP - General (Family Medicine) [name], MD as Consulting Physician (Orthopedic Surgery) Molli Posey, MD as Consulting Physician (Obstetrics and Gynecology)  Extended Emergency Contact Information Primary Emergency Contact: Sneed,Clyde Address: [place], [place] [address] [address] of [place] Phone: [phone] Work Phone: [phone] Mobile Phone: [phone] Relation: Spouse  Goals of care: Advanced Directive information Advanced Directives 01/14/2019  Does Patient Have a Medical Advance Directive? No  Would patient like information on creating a medical advance directive? No - Patient declined     Chief Complaint  Patient presents with  . Medical Management of Chronic Issues    Follow up on bp medications,   . Quality Metric Gaps    Patient would like Tdap and Flu Vaccine     HPI:  Pt is a 53 y.o. female seen today for medical management of chronic diseases.she has a significant medical history of high blood pressure.she states does not check her blood pressure at home but can start checking since her mother has a B/P cuff.she states has leg swelling but tries to keep legs elevated at work.she denies any headache,dizziness or chest pain.she does not exercise but states has a bike that she plans to start exercising.she has made changes in her diet since two weeks ago now using air fry machine instead of drip frying her chicken.   She is due for follow up lab work but not fasting this visit.will come back for labs to be drawn.  Health Maintenance:  Due for colon cancer screening.discussed importance of screening.States will plan for colonoscopy then notify provider when ready to go for procedure.   Immunization- Needs annual influenza vaccine.Denies any acute illness,fever or chills. - Also due for Tdap vaccine.will send order to her  pharmacy this visit.     Past Medical History:  Diagnosis Date  . Anemia    Past Surgical History:  Procedure Laterality Date  . ABDOMINAL HYSTERECTOMY Bilateral 11/29/2015   Procedure: HYSTERECTOMY ABDOMINAL;  Surgeon: Molli Posey, MD;  Location: [facility];  Service: Gynecology;  Laterality: Bilateral;  . BILATERAL SALPINGECTOMY Bilateral 11/29/2015   Procedure: BILATERAL SALPINGECTOMY;  Surgeon: Molli Posey, MD;  Location: [facility];  Service: Gynecology;  Laterality: Bilateral;  . BREAST SURGERY  2007   reduction, Cristine Polio, MD  . VAGINAL DELIVERY    . WISDOM TOOTH EXTRACTION Bilateral age - early 52s   all 5 removed    No Known Allergies  Allergies as of 01/14/2019   No Known Allergies     Medication List       Accurate as of January 14, 2019  3:50 PM. Always use your most recent med list.        hydrochlorothiazide 25 MG tablet Commonly known as:  HYDRODIURIL Take 1 tablet (25 mg total) by mouth daily.   ibuprofen 200 MG tablet Commonly known as:  ADVIL,MOTRIN Take 200 mg by mouth every 6 (six) hours as needed.   multivitamin tablet Take 1 tablet by mouth daily.   Tdap 5-2.5-18.5 LF-MCG/0.5 injection Commonly known as:  BOOSTRIX Inject 0.5 mLs into the muscle once for 1 dose.       Review of Systems  Constitutional: Negative for appetite change, chills, fatigue, fever and unexpected weight change.  HENT: Negative for congestion, ear pain, hearing loss, postnasal drip, rhinorrhea, sinus pressure, sinus pain, sneezing  and sore throat.   Eyes: Positive for visual disturbance. Negative for photophobia, pain, discharge, redness and itching.       Wears bifocal and follows up with Ophthalmology   Respiratory: Negative for cough, chest tightness, shortness of breath and wheezing.   Cardiovascular: Negative for chest pain, palpitations and leg swelling.  Gastrointestinal: Negative for abdominal distention, abdominal pain, constipation, diarrhea, nausea and  vomiting.  Endocrine: Negative for cold intolerance, heat intolerance, polydipsia, polyphagia and polyuria.  Genitourinary: Negative for dysuria, flank pain, frequency and urgency.  Musculoskeletal: Negative for arthralgias and gait problem.  Skin: Negative for color change, pallor and rash.  Neurological: Negative for dizziness, light-headedness, numbness and headaches.  Hematological: Does not bruise/bleed easily.  Psychiatric/Behavioral: Negative for agitation and sleep disturbance. The patient is not nervous/anxious.     Immunization History  Administered Date(s) Administered  . Influenza Inj Mdck Quad Pf 09/19/2017  . Influenza, Quadrivalent, Recombinant, Inj, Pf 09/24/2017  . Influenza,inj,Quad PF,6+ Mos 11/30/2015  . Influenza-Unspecified 09/07/2014   Pertinent  Health Maintenance Due  Topic Date Due  . INFLUENZA VACCINE  06/24/2018  . MAMMOGRAM  07/18/2018  . COLONOSCOPY  07/18/2018  . PAP SMEAR-Modifier  Discontinued   Fall Risk  03/16/2018 01/26/2018  Falls in the past year? No No    Vitals:   01/14/19 1516  BP: 118/80  Pulse: 75  Temp: 98.4 F (36.9 C)  TempSrc: Oral  SpO2: 98%  Weight: 185 lb (83.9 kg)  Height: '5\' 7"'$  (1.702 m)   Body mass index is 28.98 kg/m. Physical Exam Constitutional:      General: She is not in acute distress. HENT:     Head: Normocephalic.     Right Ear: Tympanic membrane, ear canal and external ear normal. There is no impacted cerumen.     Left Ear: Tympanic membrane, ear canal and external ear normal. There is no impacted cerumen.     Nose: Nose normal. No congestion or rhinorrhea.     Mouth/Throat:     Mouth: Mucous membranes are moist.     Pharynx: Oropharynx is clear. No oropharyngeal exudate or posterior oropharyngeal erythema.  Eyes:     General: No scleral icterus.       Right eye: No discharge.        Left eye: No discharge.     Conjunctiva/sclera: Conjunctivae normal.     Pupils: Pupils are equal, round, and reactive  to light.  Neck:     Musculoskeletal: Normal range of motion. No neck rigidity or muscular tenderness.     Vascular: No carotid bruit.  Cardiovascular:     Rate and Rhythm: Normal rate and regular rhythm.     Pulses: Normal pulses.     Heart sounds: Normal heart sounds. No murmur. No friction rub. No gallop.   Pulmonary:     Effort: Pulmonary effort is normal. No respiratory distress.     Breath sounds: Normal breath sounds. No wheezing, rhonchi or rales.  Chest:     Chest wall: No tenderness.  Abdominal:     General: Bowel sounds are normal. There is no distension.     Palpations: Abdomen is soft. There is no mass.     Tenderness: There is no abdominal tenderness. There is no right CVA tenderness, left CVA tenderness, guarding or rebound.  Musculoskeletal: Normal range of motion.        General: No swelling or tenderness.     Right lower leg: No edema.     Left  lower leg: No edema.  Lymphadenopathy:     Cervical: No cervical adenopathy.  Skin:    General: Skin is warm and dry.     Coloration: Skin is not pale.     Findings: No erythema or rash.  Neurological:     Mental Status: She is alert and oriented to person, place, and time.     Cranial Nerves: No cranial nerve deficit.     Sensory: No sensory deficit.     Motor: No weakness.     Coordination: Coordination normal.     Gait: Gait normal.  Psychiatric:        Mood and Affect: Mood normal.        Behavior: Behavior normal.        Thought Content: Thought content normal.        Judgment: Judgment normal.    Labs reviewed: Recent Labs    01/26/18 1613  NA 139  K 4.1  CL 105  CO2 27  GLUCOSE 108  BUN 17  CREATININE 0.94  CALCIUM 9.0   Recent Labs    01/26/18 1613  AST 13  ALT 14  BILITOT 0.5  PROT 7.1   Recent Labs    01/26/18 1613  WBC 5.6  NEUTROABS 3,276  HGB 13.7  HCT 39.8  MCV 90.5  PLT 450*   Lab Results  Component Value Date   TSH 1.590 01/03/2015   No results found for: HGBA1C Lab  Results  Component Value Date   CHOL 225 (H) 01/03/2015   HDL 82 01/03/2015   LDLCALC 122 (H) 01/03/2015   TRIG 105 01/03/2015   CHOLHDL 2.7 01/03/2015    Significant Diagnostic Results in last 30 days:  No results found.  Assessment/Plan 1. Essential hypertension B/p stable.Negative exam findings.continue on HCZT  - hydrochlorothiazide (HYDRODIURIL) 25 MG tablet; Take 1 tablet (25 mg total) by mouth daily.  Dispense: 90 tablet; Refill: 1 - CBC with Differential/Platelet; Future - CMP with eGFR([facility]); Future - TSH; Future - Lipid panel; Future  2. Need for Tdap vaccination Afebrile. - Tdap (BOOSTRIX) 5-2.5-18.5 LF-MCG/0.5 injection; Inject 0.5 mLs into the muscle once for 1 dose.  Dispense: 0.5 mL; Refill: 0  3. Need for influenza vaccination  Afebrile.Administer influenza vaccine  Via I.M today.   Family/ staff Communication: Reviewed plan of care with patient.  Labs/tests ordered:  - CBC with Differential/Platelet; Future - CMP with eGFR([facility]); Future - TSH; Future - Lipid panel; Future  Sandrea Hughs, NP

## 2019-03-20 ENCOUNTER — Encounter: Payer: Self-pay | Admitting: Family

## 2019-06-09 DIAGNOSIS — I10 Essential (primary) hypertension: Secondary | ICD-10-CM | POA: Insufficient documentation

## 2019-06-09 LAB — CBC AND DIFFERENTIAL: Hemoglobin: 14.6 (ref 12.0–16.0)

## 2019-06-14 LAB — HM PAP SMEAR: HM Pap smear: NEGATIVE

## 2019-06-14 LAB — HM MAMMOGRAPHY

## 2019-07-20 ENCOUNTER — Other Ambulatory Visit: Payer: Self-pay | Admitting: Family

## 2019-07-20 ENCOUNTER — Ambulatory Visit (INDEPENDENT_AMBULATORY_CARE_PROVIDER_SITE_OTHER): Payer: 59 | Admitting: Family

## 2019-07-20 ENCOUNTER — Encounter: Payer: Self-pay | Admitting: Family

## 2019-07-20 ENCOUNTER — Other Ambulatory Visit: Payer: Self-pay

## 2019-07-20 VITALS — BP 122/80 | HR 71 | Temp 98.7°F | Ht 67.0 in | Wt 163.8 lb

## 2019-07-20 DIAGNOSIS — Z1211 Encounter for screening for malignant neoplasm of colon: Secondary | ICD-10-CM

## 2019-07-20 DIAGNOSIS — E785 Hyperlipidemia, unspecified: Secondary | ICD-10-CM | POA: Diagnosis not present

## 2019-07-20 DIAGNOSIS — I10 Essential (primary) hypertension: Secondary | ICD-10-CM | POA: Diagnosis not present

## 2019-07-20 DIAGNOSIS — Z23 Encounter for immunization: Secondary | ICD-10-CM

## 2019-07-20 MED ORDER — TETANUS-DIPHTH-ACELL PERTUSSIS 5-2-15.5 LF-MCG/0.5 IM SUSP: 0.5000 mL | Freq: Once | INTRAMUSCULAR | 0 refills | Status: AC

## 2019-07-20 MED ORDER — HYDROCHLOROTHIAZIDE 25 MG PO TABS: 12.5000 mg | ORAL_TABLET | Freq: Every day | ORAL | 1 refills | Status: DC

## 2019-07-20 NOTE — Progress Notes (Signed)
Provider: Marlowe Sax FNP-C   Penny Ward, Nelda Bucks, NP  Patient Care Team: Aysel Gilchrest, Nelda Bucks, NP as PCP - General (Family Medicine) [name], MD as Consulting Physician (Orthopedic Surgery) Molli Posey, MD as Consulting Physician (Obstetrics and Gynecology)  Extended Emergency Contact Information Primary Emergency Contact: Morsch,Clyde Address: [place], [place] [address] [address]nette Litter of [place] Phone: [phone] Work Phone: [phone] Mobile Phone: [phone] Relation: Spouse  Code Status: Full Code  Goals of care: Advanced Directive information Advanced Directives 01/14/2019  Does Patient Have a Medical Advance Directive? No  Would patient like information on creating a medical advance directive? No - Patient declined     Chief Complaint  Patient presents with  . Medical Management of Chronic Issues    6 month follow up   . Quality Metric Gaps    Mammogram has been done at gyn office, HIV Screening, Flu Vaccine done today, TDap sent to patient's pharmacy, Colonoscopy     HPI:  Pt is a 53 y.o. female seen today for medical management of chronic diseases.she denies any acute issues this visit.she states has been exercising by walking 3-4 times per week as advised in previous visit.she has also changed her diet trying to eat healthy.Also eats early dinner before 5 pm and nothing after that. she drinks water instead of sodas.she is so happy has lost 21.2 lbs over 6 months since Feburary,2020 last visit.Patient complemented for the dietary and lifestyle changes.   she has had a Mammogram done at [facility] office.she is due for a  Flu Vaccine and TDap vaccine.she will receive flu shot this visit and Tdap vaccine sent to patient's pharmacy. Colonoscopy previous ordered but states does not have the copay for colonoscopy prefers cologuard.  Hypertension - currently on Hydrochlorothiazide 25 mg tablet daily.she took a picture of her blood pressure log  readings in the 100's/70's-120's/70's.HR running in the 70's-80's.with her dietary and lifestyle changes will adjust her blood pressure medication.      Past Medical History:  Diagnosis Date  . Anemia    Past Surgical History:  Procedure Laterality Date  . ABDOMINAL HYSTERECTOMY Bilateral 11/29/2015   Procedure: HYSTERECTOMY ABDOMINAL;  Surgeon: Molli Posey, MD;  Location: [facility];  Service: Gynecology;  Laterality: Bilateral;  . BILATERAL SALPINGECTOMY Bilateral 11/29/2015   Procedure: BILATERAL SALPINGECTOMY;  Surgeon: Molli Posey, MD;  Location: [facility];  Service: Gynecology;  Laterality: Bilateral;  . BREAST SURGERY  2007   reduction, Cristine Polio, MD  . VAGINAL DELIVERY    . WISDOM TOOTH EXTRACTION Bilateral age - early 19s   all 1 removed    No Known Allergies  Allergies as of 07/20/2019   No Known Allergies     Medication List       Accurate as of July 20, 2019  3:48 PM. If you have any questions, ask your nurse or doctor.        hydrochlorothiazide 25 MG tablet Commonly known as: HYDRODIURIL TAKE 1 TABLET BY MOUTH EVERY DAY   ibuprofen 200 MG tablet Commonly known as: ADVIL Take 200 mg by mouth every 6 (six) hours as needed.   multivitamin tablet Take 1 tablet by mouth daily.   Tdap 03-25-14.5 LF-MCG/0.5 injection Commonly known as: ADACEL Inject 0.5 mLs into the muscle once for 1 dose.   valACYclovir 500 MG tablet Commonly known as: VALTREX Take 500 mg by mouth daily.       Review of Systems  Constitutional: Negative for activity change, appetite change, chills, fatigue and fever.  HENT: Negative for congestion, rhinorrhea, sinus pressure, sinus pain, sneezing and sore throat.   Eyes: Negative for discharge, redness, itching and visual disturbance.  Respiratory: Negative for cough, chest tightness, shortness of breath and wheezing.   Cardiovascular: Negative for chest pain, palpitations and leg swelling.  Gastrointestinal: Negative for  abdominal distention, abdominal pain, constipation, diarrhea, nausea and vomiting.  Endocrine: Negative for cold intolerance, heat intolerance, polydipsia, polyphagia and polyuria.  Genitourinary: Negative for difficulty urinating, dysuria, flank pain, frequency and urgency.  Musculoskeletal: Negative for gait problem.  Skin: Negative for color change, pallor and rash.  Neurological: Negative for dizziness, light-headedness, numbness and headaches.  Hematological: Does not bruise/bleed easily.  Psychiatric/Behavioral: Negative for agitation, sleep disturbance and suicidal ideas. The patient is not nervous/anxious.     Immunization History  Administered Date(s) Administered  . Influenza Inj Mdck Quad Pf 09/19/2017  . Influenza, Quadrivalent, Recombinant, Inj, Pf 09/24/2017  . Influenza,inj,Quad PF,6+ Mos 11/30/2015, 01/14/2019, 07/20/2019  . Influenza-Unspecified 09/07/2014   Pertinent  Health Maintenance Due  Topic Date Due  . MAMMOGRAM  07/18/2018  . COLONOSCOPY  07/18/2018  . INFLUENZA VACCINE  Completed  . PAP SMEAR-Modifier  Discontinued   Fall Risk  03/16/2018 01/26/2018  Falls in the past year? No No    Vitals:   07/20/19 1523  BP: 122/80  Pulse: 71  Temp: 98.7 F (37.1 C)  TempSrc: Oral  SpO2: 98%  Weight: 163 lb 12.8 oz (74.3 kg)  Height: 5\' 7"  (1.702 m)   Body mass index is 25.65 kg/m. Physical Exam Constitutional:      General: She is not in acute distress.    Appearance: She is overweight. She is not ill-appearing.  HENT:     Head: Normocephalic.     Right Ear: Tympanic membrane, ear canal and external ear normal. There is no impacted cerumen.     Left Ear: Tympanic membrane, ear canal and external ear normal. There is no impacted cerumen.     Nose: Nose normal. No congestion or rhinorrhea.     Mouth/Throat:     Mouth: Mucous membranes are moist.     Pharynx: Oropharynx is clear. No oropharyngeal exudate or posterior oropharyngeal erythema.  Eyes:      General: No scleral icterus.       Right eye: No discharge.        Left eye: No discharge.     Extraocular Movements: Extraocular movements intact.     Conjunctiva/sclera: Conjunctivae normal.     Pupils: Pupils are equal, round, and reactive to light.  Neck:     Musculoskeletal: Normal range of motion. No neck rigidity or muscular tenderness.     Vascular: No carotid bruit.  Cardiovascular:     Rate and Rhythm: Normal rate and regular rhythm.     Pulses: Normal pulses.     Heart sounds: Normal heart sounds. No murmur. No friction rub. No gallop.   Pulmonary:     Effort: Pulmonary effort is normal. No respiratory distress.     Breath sounds: Normal breath sounds. No wheezing, rhonchi or rales.  Chest:     Chest wall: No tenderness.  Abdominal:     General: Abdomen is flat. Bowel sounds are normal. There is no distension.     Palpations: Abdomen is soft. There is no mass.     Tenderness: There is no abdominal tenderness. There is no right CVA tenderness, left CVA tenderness, guarding  or rebound.  Musculoskeletal: Normal range of motion.        General: No swelling, tenderness or deformity.     Right lower leg: No edema.     Left lower leg: No edema.  Lymphadenopathy:     Cervical: No cervical adenopathy.  Skin:    General: Skin is warm and dry.     Coloration: Skin is not pale.     Findings: No bruising, erythema, lesion or rash.  Neurological:     Mental Status: She is alert and oriented to person, place, and time.     Cranial Nerves: No cranial nerve deficit.     Sensory: No sensory deficit.     Motor: No weakness.     Coordination: Coordination normal.     Gait: Gait normal.  Psychiatric:        Mood and Affect: Mood normal.        Behavior: Behavior normal.        Thought Content: Thought content normal.        Judgment: Judgment normal.    Labs reviewed: No results for input(s): NA, K, CL, CO2, GLUCOSE, BUN, CREATININE, CALCIUM, MG, PHOS in the last 8760 hours. No  results for input(s): AST, ALT, ALKPHOS, BILITOT, PROT, ALBUMIN in the last 8760 hours. No results for input(s): WBC, NEUTROABS, HGB, HCT, MCV, PLT in the last 8760 hours. Lab Results  Component Value Date   TSH 1.590 01/03/2015   No results found for: HGBA1C Lab Results  Component Value Date   CHOL 225 (H) 01/03/2015   HDL 82 01/03/2015   LDLCALC 122 (H) 01/03/2015   TRIG 105 01/03/2015   CHOLHDL 2.7 01/03/2015    Significant Diagnostic Results in last 30 days:  No results found.  Assessment/Plan 1. Need for influenza vaccination Afebrile.Influenza vaccine given by CMA Ruthell Rummage - Flu Vaccine QUAD 6+ mos PF IM (Fluarix Quad PF)  2. Need for Tdap vaccination - Tdap (ADACEL) 03-25-14.5 LF-MCG/0.5 injection; Inject 0.5 mLs into the muscle once for 1 dose.  Dispense: 0.5 mL; Refill: 0  3. Essential hypertension B/p and HR  reviewed at goal.Has been exercising 3-4 times per week and has changed her diet.will reduce her HCZT to prevent hypotension. She will monitor her blood pressure three time per week.Notify provider if SBP> 140 will discontinue HZCT if blood remains stable. - hydrochlorothiazide (HYDRODIURIL) 25 MG tablet; Take 0.5 tablets (12.5 mg total) by mouth daily.  Dispense: 90 tablet; Refill: 1  4. Hyperlipidemia LDL goal <100 Lipid panel order on previous visit but did not come in for labs.Will return fasting for lab work. Great Job ! With her dietary and lifestyle modification.encouraged to continue with her exercise and dietary changes.   5. Screening for colon cancer Colonoscopy previous ordered but not done due to co-pay prefers the Cologuard.Cologuard ordered this visit.   Family/ staff Communication: Reviewed plan of care with patient.   Labs/tests ordered: Has labs pending to be drawn.will come back fasting.   Sandrea Hughs, NP

## 2019-08-12 ENCOUNTER — Other Ambulatory Visit: Payer: 59

## 2019-08-23 LAB — COLOGUARD: Cologuard: POSITIVE — AB

## 2019-08-30 ENCOUNTER — Telehealth: Payer: Self-pay | Admitting: Family

## 2019-08-30 ENCOUNTER — Encounter: Payer: Self-pay | Admitting: *Deleted

## 2019-08-30 NOTE — Telephone Encounter (Signed)
Tried calling patient. No answer. LMOM for patient to call office.

## 2019-08-30 NOTE — Telephone Encounter (Signed)
Cologuard results were positive. Diagnostic colonoscopy recommended to rule out cancerous cells or polyps.

## 2019-08-30 NOTE — Telephone Encounter (Signed)
Let hope patient will call back but if not may try again tomorrow.

## 2019-08-31 NOTE — Telephone Encounter (Signed)
Tried another attempt to call patient and discuss results. No answer. LMOM for patient to call office.

## 2019-09-06 NOTE — Telephone Encounter (Signed)
Tried another attempt to contact patient. No answer. LMOM for patient to call office.

## 2019-09-08 NOTE — Telephone Encounter (Signed)
Tried another attempt to reach the patient with no answer. LMOM. I also have composed a letter and mailed to patient today. We have tried several attempts to reach patient with no answer and patient will not return phone call. A copy of letter was placed in mail for patient to call office regarding results.

## 2019-09-16 ENCOUNTER — Telehealth: Payer: Self-pay

## 2019-09-16 NOTE — Telephone Encounter (Signed)
Patient called in response to a letter she received to call the office about Cologuard results.  The results/recommendations were given to the patient.

## 2019-09-22 ENCOUNTER — Telehealth: Payer: Self-pay

## 2019-09-22 NOTE — Telephone Encounter (Signed)
Refer to Gastroenterology for positive Cologuard.

## 2019-09-22 NOTE — Telephone Encounter (Signed)
Have you spoke to pt about cologard positive results? A Gastro referral needs to placed.  Thanks, Kathyrn Lass

## 2019-09-22 NOTE — Telephone Encounter (Signed)
Patient called and updated her new phone number. She received a letter stating her cologaurd was positive and need a colonoscopy. She would like more info about her positive result and where to get colonoscopy.

## 2019-09-23 NOTE — Telephone Encounter (Signed)
Patient was previously contacted and results letter was mailed.Recent changed her phone number.

## 2019-09-23 NOTE — Telephone Encounter (Signed)
Any additional info I should give the patient, other than "positive"?

## 2019-09-26 ENCOUNTER — Encounter: Payer: Self-pay | Admitting: Gastroenterology

## 2019-09-26 ENCOUNTER — Other Ambulatory Visit: Payer: Self-pay | Admitting: Family

## 2019-09-26 DIAGNOSIS — R195 Other fecal abnormalities: Secondary | ICD-10-CM

## 2019-09-26 DIAGNOSIS — Z1211 Encounter for screening for malignant neoplasm of colon: Secondary | ICD-10-CM

## 2019-09-26 NOTE — Telephone Encounter (Signed)
The patient called and was asking what the next step was for her positive Cologuard.  She was waiting for someone to let her know.  After speaking with Lattie Haw she stated the provider needed to put in a referral for a colonoscopy.

## 2019-09-26 NOTE — Telephone Encounter (Signed)
Referral to GI ordered

## 2019-11-01 ENCOUNTER — Encounter: Payer: Self-pay | Admitting: Gastroenterology

## 2019-11-01 ENCOUNTER — Other Ambulatory Visit: Payer: Self-pay

## 2019-11-01 ENCOUNTER — Ambulatory Visit (INDEPENDENT_AMBULATORY_CARE_PROVIDER_SITE_OTHER): Payer: 59 | Admitting: Gastroenterology

## 2019-11-01 VITALS — BP 112/80 | HR 72 | Temp 98.1°F | Ht 67.0 in | Wt 170.0 lb

## 2019-11-01 DIAGNOSIS — R195 Other fecal abnormalities: Secondary | ICD-10-CM

## 2019-11-01 DIAGNOSIS — K921 Melena: Secondary | ICD-10-CM

## 2019-11-01 DIAGNOSIS — Z1159 Encounter for screening for other viral diseases: Secondary | ICD-10-CM

## 2019-11-01 MED ORDER — NA SULFATE-K SULFATE-MG SULF 17.5-3.13-1.6 GM/177ML PO SOLN: 1.0000 | Freq: Once | ORAL | 0 refills | Status: AC

## 2019-11-01 NOTE — Progress Notes (Signed)
[facility] Consult Note:  History: Penny Ward 11/01/2019  Referring provider: Sandrea Hughs, NP  Reason for consult/chief complaint: Blood In Stools (Positive Cologuard test, patient has occassionally seen blood in stool, family hx of colon polyps)   Subjective  HPI:  Penny Ward is a very pleasant 53 year old woman with a recent positive Cologuard test.  For years she is occasionally seen a small amount of blood on the paper or on the stool, even if she does not strain for bowel movements.  Bowel habits generally regular, no chronic abdominal pain.  Both sisters had colon polyps, no family history colorectal cancer.   ROS:  Review of Systems  Constitutional: Negative for appetite change and unexpected weight change.  HENT: Negative for mouth sores and voice change.   Eyes: Negative for pain and redness.  Respiratory: Negative for cough and shortness of breath.   Cardiovascular: Negative for chest pain and palpitations.  Genitourinary: Negative for dysuria and hematuria.  Musculoskeletal: Negative for arthralgias and myalgias.  Skin: Negative for pallor and rash.  Neurological: Negative for weakness and headaches.  Hematological: Negative for adenopathy.     Past Medical History: Past Medical History:  Diagnosis Date  . Anemia   . Hypertension      Past Surgical History: Past Surgical History:  Procedure Laterality Date  . ABDOMINAL HYSTERECTOMY Bilateral 11/29/2015   Procedure: HYSTERECTOMY ABDOMINAL;  Surgeon: Molli Posey, MD;  Location: [facility];  Service: Gynecology;  Laterality: Bilateral;  . BILATERAL SALPINGECTOMY Bilateral 11/29/2015   Procedure: BILATERAL SALPINGECTOMY;  Surgeon: Molli Posey, MD;  Location: [facility];  Service: Gynecology;  Laterality: Bilateral;  . BREAST SURGERY  2007   reduction, Cristine Polio, MD  . VAGINAL DELIVERY    . WISDOM TOOTH EXTRACTION Bilateral age - early 53s   all 29 removed     Family History:  Family History  Problem Relation Age of Onset  . Dementia Mother   . Hypertension Mother   . Hyperlipidemia Mother   . Arthritis Father   . Fibromyalgia Sister   . Migraines Sister   . Hypertension Sister   . Arthritis Sister   . Heart murmur Sister   . Bipolar disorder Sister   . Heart attack Maternal Grandfather   . Stroke Maternal Grandmother   . Cancer Maternal Grandmother   . Arthritis Maternal Grandmother   . Cancer Paternal Grandmother     Social History: Social History   Socioeconomic History  . Marital status: Legally Separated    Spouse name: Not on file  . Number of children: 1  . Years of education: Not on file  . Highest education level: Not on file  Occupational History  . Not on file  Social Needs  . Financial resource strain: Not on file  . Food insecurity    Worry: Not on file    Inability: Not on file  . Transportation needs    Medical: Not on file    Non-medical: Not on file  Tobacco Use  . Smoking status: Never Smoker  . Smokeless tobacco: Never Used  Substance and Sexual Activity  . Alcohol use: No  . Drug use: No  . Sexual activity: Yes  Lifestyle  . Physical activity    Days per week: Not on file    Minutes per session: Not on file  . Stress: Not on file  Relationships  . Social Herbalist on phone: Not on file    Gets together: Not  on file    Attends religious service: Not on file    Active member of club or organization: Not on file    Attends meetings of clubs or organizations: Not on file    Relationship status: Not on file  Other Topics Concern  . Not on file  Social History Narrative   Diet- None   Caffeine- Yes (coffee, soda and tea)   Married- Yes 1997   House- 2 stories, 3 people   Pets- No   Current/Past profession- Marketing executive   Exercise- No   Living Will- No   DNR- No   POA/HPOA- No    Allergies: No Known Allergies  Outpatient Meds: Current Outpatient Medications  Medication Sig Dispense  Refill  . hydrochlorothiazide (HYDRODIURIL) 25 MG tablet Take 1 tablet by mouth daily.    Marland Kitchen ibuprofen (ADVIL,MOTRIN) 200 MG tablet Take 200 mg by mouth every 6 (six) hours as needed.    . Multiple Vitamin (MULTIVITAMIN) tablet Take 1 tablet by mouth daily.    . valACYclovir (VALTREX) 500 MG tablet Take 500 mg by mouth daily.     No current facility-administered medications for this visit.       ___________________________________________________________________ Objective   Exam:  BP 112/80   Pulse 72   Temp 98.1 F (36.7 C)   Ht 5\' 7"  (1.702 m)   Wt 170 lb (77.1 kg)   LMP 11/24/2015 (Exact Date)   BMI 26.63 kg/m    General: Well-appearing  Eyes: sclera anicteric, no redness  ENT: oral mucosa moist without lesions, no cervical or supraclavicular lymphadenopathy  CV: RRR without murmur, S1/S2, no JVD, no peripheral edema  Resp: clear to auscultation bilaterally, normal RR and effort noted  GI: soft, no tenderness, with active bowel sounds. No guarding or palpable organomegaly noted.  Skin; warm and dry, no rash or jaundice noted  Neuro: awake, alert and oriented x 3. Normal gross motor function and fluent speech Rectal: Normal external, normal sphincter tone, no fissure, no tenderness or palpable internal lesion Labs:  Positive Cologuard on 08/23/2019  No CBC since 2019   Assessment: Encounter Diagnoses  Name Primary?  . Positive colorectal cancer screening using Cologuard test Yes  . Blood in stool     Positive Cologuard test, most likely means 1 or more colon polyps, less likely from occult blood in stool, false positive or colon cancer  Plan:  Colonoscopy.  She is agreeable after discussion of procedure and risks.  The benefits and risks of the planned procedure were described in detail with the patient or (when appropriate) their health care proxy.  Risks were outlined as including, but not limited to, bleeding, infection, perforation, adverse medication  reaction leading to cardiac or pulmonary decompensation, pancreatitis (if ERCP).  The limitation of incomplete mucosal visualization was also discussed.  No guarantees or warranties were given.   Thank you for the courtesy of this consult.  Please call me with any questions or concerns.  Nelida Meuse III  CC: Referring provider noted above

## 2019-11-01 NOTE — Patient Instructions (Signed)
If you are age 51 or older, your body mass index should be between 23-30. Your Body mass index is 26.63 kg/m. If this is out of the aforementioned range listed, please consider follow up with your Primary Care Provider.  If you are age 16 or younger, your body mass index should be between 19-25. Your Body mass index is 26.63 kg/m. If this is out of the aformentioned range listed, please consider follow up with your Primary Care Provider.   You have been scheduled for a colonoscopy. Please follow written instructions given to you at your visit today.  Please pick up your prep supplies at the pharmacy within the next 1-3 days. If you use inhalers (even only as needed), please bring them with you on the day of your procedure.  It was a pleasure to see you today!  Dr. Loletha Carrow

## 2019-11-04 ENCOUNTER — Telehealth: Payer: Self-pay | Admitting: Gastroenterology

## 2019-11-04 NOTE — Telephone Encounter (Signed)
Vivien Rota, can you please help with this?  I sent you a separate message. This is much appreciated.

## 2019-11-04 NOTE — Telephone Encounter (Signed)
Pt states that copay for suprep is over $85. She was told that her insurance covers gavylite so wants to know if she could have that one.

## 2019-11-04 NOTE — Telephone Encounter (Signed)
Sample of Suprep has been given to the patient.

## 2019-11-07 ENCOUNTER — Ambulatory Visit (INDEPENDENT_AMBULATORY_CARE_PROVIDER_SITE_OTHER): Payer: 59

## 2019-11-07 DIAGNOSIS — Z1159 Encounter for screening for other viral diseases: Secondary | ICD-10-CM

## 2019-11-08 ENCOUNTER — Encounter: Payer: Self-pay | Admitting: Gastroenterology

## 2019-11-08 LAB — SARS CORONAVIRUS 2 (TAT 6-24 HRS): SARS Coronavirus 2: NEGATIVE

## 2019-11-09 ENCOUNTER — Ambulatory Visit (AMBULATORY_SURGERY_CENTER): Payer: 59 | Admitting: Gastroenterology

## 2019-11-09 ENCOUNTER — Encounter: Payer: Self-pay | Admitting: Gastroenterology

## 2019-11-09 ENCOUNTER — Other Ambulatory Visit: Payer: Self-pay

## 2019-11-09 VITALS — BP 118/82 | HR 71 | Temp 98.7°F | Resp 37 | Ht 67.0 in | Wt 170.0 lb

## 2019-11-09 DIAGNOSIS — D128 Benign neoplasm of rectum: Secondary | ICD-10-CM | POA: Diagnosis not present

## 2019-11-09 DIAGNOSIS — R195 Other fecal abnormalities: Secondary | ICD-10-CM

## 2019-11-09 MED ORDER — SODIUM CHLORIDE 0.9 % IV SOLN: 500.0000 mL | Freq: Once | INTRAVENOUS | Status: DC

## 2019-11-09 NOTE — Op Note (Signed)
[facility] Patient Name: Penny Ward Procedure Date: 11/09/2019 10:28 AM MRN: [id number] Endoscopist: Mallie Mussel L. Loletha Carrow , MD Age: 53 Referring MD:  Date of Birth: [birth year] Gender: Female Account #: [id number] Procedure:                Colonoscopy Indications:              Positive Cologuard test Medicines:                Monitored Anesthesia Care Procedure:                Pre-Anesthesia Assessment:                           - Prior to the procedure, a History and Physical                            was performed, and patient medications and                            allergies were reviewed. The patient's tolerance of                            previous anesthesia was also reviewed. The risks                            and benefits of the procedure and the sedation                            options and risks were discussed with the patient.                            All questions were answered, and informed consent                            was obtained. Prior Anticoagulants: The patient has                            taken no previous anticoagulant or antiplatelet                            agents. ASA Grade Assessment: II - A patient with                            mild systemic disease. After reviewing the risks                            and benefits, the patient was deemed in                            satisfactory condition to undergo the procedure.                           After obtaining informed consent, the colonoscope  was passed under direct vision. Throughout the                            procedure, the patient's blood pressure, pulse, and                            oxygen saturations were monitored continuously. The                            Colonoscope was introduced through the anus and                            advanced to the the cecum, identified by                            appendiceal orifice and ileocecal valve. The                           colonoscopy was performed without difficulty. The                            patient tolerated the procedure well. The quality                            of the bowel preparation was excellent. The                            ileocecal valve, appendiceal orifice, and rectum                            were photographed. The bowel preparation used was                            SUPREP. Scope In: 10:41:59 AM Scope Out: 11:00:05 AM Scope Withdrawal Time: 0 hours 12 minutes 48 seconds  Total Procedure Duration: 0 hours 18 minutes 6 seconds  Findings:                 The perianal and digital rectal examinations were                            normal.                           A 12-14 mm polyp was found in the proximal rectum                            (16-18 cm from anal verge). The polyp was                            pedunculated. The polyp was removed with a hot                            snare. Resection and retrieval were complete.  The exam was otherwise without abnormality on                            direct and retroflexion views. Complications:            No immediate complications. Estimated Blood Loss:     Estimated blood loss: none. Impression:               - One 12-14 mm polyp in the proximal rectum,                            removed with a hot snare. Resected and retrieved.                           - The examination was otherwise normal on direct                            and retroflexion views. Recommendation:           - Patient has a contact number available for                            emergencies. The signs and symptoms of potential                            delayed complications were discussed with the                            patient. Return to normal activities tomorrow.                            Written discharge instructions were provided to the                            patient.                           -  Resume previous diet.                           - Continue present medications.                           - Await pathology results.                           - Repeat colonoscopy is recommended for                            surveillance. The colonoscopy date will be                            determined after pathology results from today's                            exam become available for review. Anacristina Steffek L. Danis, MD 11/09/2019 11:05:17 AM This report has been  signed electronically.

## 2019-11-09 NOTE — Progress Notes (Signed)
To PACU, VSS. Report to [email or web address] 

## 2019-11-09 NOTE — Patient Instructions (Signed)
Handout given:  Polyps Resume previous diet Continue current medications Await Pathology results     YOU HAD AN ENDOSCOPIC PROCEDURE TODAY AT [facility]:   Refer to the procedure report that was given to you for any specific questions about what was found during the examination.  If the procedure report does not answer your questions, please call your gastroenterologist to clarify.  If you requested that your care partner not be given the details of your procedure findings, then the procedure report has been included in a sealed envelope for you to review at your convenience later.  YOU SHOULD EXPECT: Some feelings of bloating in the abdomen. Passage of more gas than usual.  Walking can help get rid of the air that was put into your GI tract during the procedure and reduce the bloating. If you had a lower endoscopy (such as a colonoscopy or flexible sigmoidoscopy) you may notice spotting of blood in your stool or on the toilet paper. If you underwent a bowel prep for your procedure, you may not have a normal bowel movement for a few days.  Please Note:  You might notice some irritation and congestion in your nose or some drainage.  This is from the oxygen used during your procedure.  There is no need for concern and it should clear up in a day or so.  SYMPTOMS TO REPORT IMMEDIATELY:   Following lower endoscopy (colonoscopy or flexible sigmoidoscopy):  Excessive amounts of blood in the stool  Significant tenderness or worsening of abdominal pains  Swelling of the abdomen that is new, acute  Fever of 100F or higher   For urgent or emergent issues, a gastroenterologist can be reached at any hour by calling [phone].   DIET:  We do recommend a small meal at first, but then you may proceed to your regular diet.  Drink plenty of fluids but you should avoid alcoholic beverages for 24 hours.  ACTIVITY:  You should plan to take it easy for the rest of today and you should  NOT DRIVE or use heavy machinery until tomorrow (because of the sedation medicines used during the test).    FOLLOW UP: Our staff will call the number listed on your records 48-72 hours following your procedure to check on you and address any questions or concerns that you may have regarding the information given to you following your procedure. If we do not reach you, we will leave a message.  We will attempt to reach you two times.  During this call, we will ask if you have developed any symptoms of COVID 19. If you develop any symptoms (ie: fever, flu-like symptoms, shortness of breath, cough etc.) before then, please call [phone].  If you test positive for Covid 19 in the 2 weeks post procedure, please call and report this information to [place].    If any biopsies were taken you will be contacted by phone or by letter within the next 1-3 weeks.  Please call us at [phone] if you have not heard about the biopsies in 3 weeks.    SIGNATURES/CONFIDENTIALITY: You and/or your care partner have signed paperwork which will be entered into your electronic medical record.  These signatures attest to the fact that that the information above on your After Visit Summary has been reviewed and is understood.  Full responsibility of the confidentiality of this discharge information lies with you and/or your care-partner.

## 2019-11-09 NOTE — Progress Notes (Signed)
VS-DT Temp-LC  

## 2019-11-09 NOTE — Progress Notes (Signed)
Called to room to assist during endoscopic procedure.  Patient ID and intended procedure confirmed with present staff. Received instructions for my participation in the procedure from the performing physician.  

## 2019-11-11 ENCOUNTER — Telehealth: Payer: Self-pay | Admitting: *Deleted

## 2019-11-11 NOTE — Telephone Encounter (Signed)
  Follow up Call-  Call back number 11/09/2019  Post procedure Call Back phone  # [phone]  Permission to leave phone message Yes  Some recent data might be hidden     Patient questions:  Do you have a fever, pain , or abdominal swelling? No. Pain Score  0 *  Have you tolerated food without any problems? Yes.    Have you been able to return to your normal activities? Yes.    Do you have any questions about your discharge instructions: Diet   No. Medications  No. Follow up visit  No.  Do you have questions or concerns about your Care? No.  Actions: * If pain score is 4 or above: No action needed, pain <4.  1. Have you developed a fever since your procedure? no  2.   Have you had an respiratory symptoms (SOB or cough) since your procedure? no  3.   Have you tested positive for COVID 19 since your procedure no  4.   Have you had any family members/close contacts diagnosed with the COVID 19 since your procedure?  no   If yes to any of these questions please route to Joylene John, RN and Alphonsa Gin, Therapist, sports.

## 2019-11-14 ENCOUNTER — Encounter: Payer: Self-pay | Admitting: Gastroenterology

## 2020-01-06 ENCOUNTER — Ambulatory Visit: Payer: 59

## 2020-01-23 ENCOUNTER — Encounter: Payer: Self-pay | Admitting: Family

## 2020-01-23 ENCOUNTER — Ambulatory Visit (INDEPENDENT_AMBULATORY_CARE_PROVIDER_SITE_OTHER): Payer: 59 | Admitting: Family

## 2020-01-23 ENCOUNTER — Other Ambulatory Visit: Payer: Self-pay

## 2020-01-23 VITALS — BP 120/88 | HR 64 | Temp 98.0°F | Ht 67.0 in | Wt 179.0 lb

## 2020-01-23 DIAGNOSIS — I10 Essential (primary) hypertension: Secondary | ICD-10-CM | POA: Diagnosis not present

## 2020-01-23 DIAGNOSIS — E785 Hyperlipidemia, unspecified: Secondary | ICD-10-CM

## 2020-01-23 DIAGNOSIS — Z6828 Body mass index (BMI) 28.0-28.9, adult: Secondary | ICD-10-CM

## 2020-01-23 DIAGNOSIS — Z113 Encounter for screening for infections with a predominantly sexual mode of transmission: Secondary | ICD-10-CM | POA: Diagnosis not present

## 2020-01-23 DIAGNOSIS — Z23 Encounter for immunization: Secondary | ICD-10-CM

## 2020-01-23 DIAGNOSIS — E663 Overweight: Secondary | ICD-10-CM

## 2020-01-23 MED ORDER — TETANUS-DIPHTH-ACELL PERTUSSIS 5-2.5-18.5 LF-MCG/0.5 IM SUSP: 0.5000 mL | Freq: Once | INTRAMUSCULAR | 0 refills | Status: AC

## 2020-01-23 NOTE — Progress Notes (Signed)
Provider: Marlowe Sax FNP-C   Tyleek Smick, Nelda Bucks, NP  Patient Care Team: Ericha Whittingham, Nelda Bucks, NP as PCP - General (Family Medicine) [name], MD as Consulting Physician (Orthopedic Surgery) Molli Posey, MD as Consulting Physician (Obstetrics and Gynecology)  Extended Emergency Contact Information Primary Emergency Contact: [place] Mobile Phone: [phone] Relation: Sister  Code Status: Full Code   Goals of care: Advanced Directive information Advanced Directives 01/14/2019  Does Patient Have a Medical Advance Directive? No  Would patient like information on creating a medical advance directive? No - Patient declined     Chief Complaint  Patient presents with  . Medical Management of Chronic Issues    [facility] follow-up    HPI:  Pt is a 54y.o. female seen today for medical management of chronic diseases.she denies any acute issues this visit. Hypertension - B/p elevated on arrival but recheck blood pressure down to normal.currently on HCZT 25 mg tablet daily.denies any headache,dizziness,N/V,change in vision,shortness of breath or chest pain.No edema reported though states has compression hose in storage but will obtain them.she keeps her legs elevated.Her diet has changed [place] who likes to eat out.she will try to get back to her diet and walking exercise.she works from home.she is motivated.she has had a 9 lbs weight gain over three months.   She had a cologuard 08/23/2019 results were positive.She was referred to Gastroenterology for colonoscopy.she had colonoscopy 11/09/2019 pre-cancerous polyp removed.she was advised to follow up colonoscopy in 3 years.  Hyperlipidemia - Latest LDL 122, TRG 105,225 she is due for lab work.she is not fasting this visit but states will come back in the morning to get lab work done.  Health Maintenance;  She is due for her T dap vaccine.advised to get injection at  her pharmacy.script will be send today. Also discussed shingles vaccine was advised that she can get injection at her pharmacy then [facility] given.  Also due for screening for STD.patient states has separated with husband.States okay to old lab work with the rest of labs in the morning.she is coping well with separation.Has one rental house to sell then she will settle on buying her own place.For now enjoys living with her older sister.    Past Medical History:  Diagnosis Date  . Anemia   . Hypertension    Past Surgical History:  Procedure Laterality Date  . ABDOMINAL HYSTERECTOMY Bilateral 11/29/2015   Procedure: HYSTERECTOMY ABDOMINAL;  Surgeon: RMolli Posey MD;  [facility];  Service: Gynecology;  Laterality: Bilateral;  . BILATERAL SALPINGECTOMY Bilateral 11/29/2015   Procedure: BILATERAL SALPINGECTOMY;  Surgeon: RMolli Posey MD;  [facility];  Service: Gynecology;  Laterality: Bilateral;  . BREAST SURGERY  2007   reduction, GCristine Polio MD  . VAGINAL DELIVERY    . WISDOM TOOTH EXTRACTION Bilateral age - early 254s  all 416removed    No Known Allergies  Allergies as of 01/23/2020   No Known Allergies     Medication List       Accurate as of January 23, 2020  3:57 PM. If you have any questions, ask your nurse or doctor.        hydrochlorothiazide 25 MG tablet Commonly known as: HYDRODIURIL Take 1 tablet by mouth daily.   ibuprofen 200 MG tablet Commonly known as: ADVIL Take 200 mg by mouth every 6 (six) hours as needed.   multivitamin tablet Take 1 tablet  by mouth daily.   Tdap 5-2.5-18.5 LF-MCG/0.5 injection Commonly known as: BOOSTRIX Inject 0.5 mLs into the muscle once for 1 dose.   valACYclovir 500 MG tablet Commonly known as: VALTREX Take 500 mg by mouth daily.       Review of Systems  Constitutional: Negative for appetite change, chills, fatigue and fever.  HENT: Negative for congestion,  hearing loss, postnasal drip, rhinorrhea, sinus pressure, sinus pain, sneezing, sore throat and trouble swallowing.   Eyes: Negative for pain, discharge, redness, itching and visual disturbance.  Respiratory: Negative for cough, chest tightness, shortness of breath and wheezing.   Cardiovascular: Negative for chest pain, palpitations and leg swelling.  Gastrointestinal: Negative for abdominal distention, abdominal pain, constipation, diarrhea, nausea and vomiting.  Endocrine: Negative for cold intolerance, heat intolerance, polydipsia, polyphagia and polyuria.  Genitourinary: Negative for difficulty urinating, dysuria, flank pain, frequency and urgency.  Musculoskeletal: Negative for arthralgias, back pain, gait problem and myalgias.  Skin: Negative for color change, pallor and rash.  Neurological: Negative for dizziness, speech difficulty, weakness, light-headedness, numbness and headaches.  Hematological: Does not bruise/bleed easily.  Psychiatric/Behavioral: Negative for agitation, behavioral problems and sleep disturbance. The patient is not nervous/anxious.        Sleeps 6 hours at night     Immunization History  Administered Date(s) Administered  . Influenza Inj Mdck Quad Pf 09/19/2017  . Influenza, Quadrivalent, Recombinant, Inj, Pf 09/24/2017  . Influenza,inj,Quad PF,6+ Mos 11/30/2015, 01/14/2019, 07/20/2019  . Influenza-Unspecified 09/07/2014   Pertinent  Health Maintenance Due  Topic Date Due  . MAMMOGRAM  07/18/2018  . COLONOSCOPY  11/15/2022  . INFLUENZA VACCINE  Completed  . PAP SMEAR-Modifier  Discontinued   Fall Risk  01/23/2020 03/16/2018 01/26/2018  Falls in the past year? 0 No No  Number falls in past yr: 0 - -  Injury with Fall? 0 - -    Vitals:   01/23/20 1519 01/23/20 1556  BP: (!) 120/100 120/88  Pulse: 64   Temp: 98 F (36.7 C)   TempSrc: Oral   SpO2: 99%   Weight: 179 lb (81.2 kg)   Height: '5\' 7"'$  (1.702 m)    Body mass index is 28.04 kg/m. Physical  Exam Vitals reviewed.  Constitutional:      General: She is not in acute distress.    Appearance: She is overweight. She is not ill-appearing.  HENT:     Head: Normocephalic.     Right Ear: Tympanic membrane, ear canal and external ear normal. There is no impacted cerumen.     Left Ear: Tympanic membrane, ear canal and external ear normal. There is no impacted cerumen.     Nose: Nose normal. No congestion or rhinorrhea.     Mouth/Throat:     Mouth: Mucous membranes are moist.     Pharynx: Oropharynx is clear. No oropharyngeal exudate or posterior oropharyngeal erythema.  Eyes:     General: No scleral icterus.       Right eye: No discharge.        Left eye: No discharge.     Extraocular Movements: Extraocular movements intact.     Conjunctiva/sclera: Conjunctivae normal.     Pupils: Pupils are equal, round, and reactive to light.  Neck:     Vascular: No carotid bruit.  Cardiovascular:     Rate and Rhythm: Normal rate and regular rhythm.     Pulses: Normal pulses.     Heart sounds: Normal heart sounds. No murmur. No friction rub. No gallop.  Pulmonary:     Effort: Pulmonary effort is normal. No respiratory distress.     Breath sounds: Normal breath sounds. No wheezing, rhonchi or rales.  Chest:     Chest wall: No tenderness.  Abdominal:     General: Bowel sounds are normal. There is no distension.     Palpations: Abdomen is soft. There is no mass.     Tenderness: There is no abdominal tenderness. There is no right CVA tenderness, left CVA tenderness, guarding or rebound.  Musculoskeletal:        General: No swelling or tenderness. Normal range of motion.     Cervical back: Normal range of motion. No rigidity or tenderness.     Right lower leg: No edema.     Left lower leg: No edema.  Lymphadenopathy:     Cervical: No cervical adenopathy.  Skin:    General: Skin is warm and dry.     Coloration: Skin is not pale.     Findings: No bruising or erythema.  Neurological:      Mental Status: She is alert and oriented to person, place, and time.     Cranial Nerves: No cranial nerve deficit.     Sensory: No sensory deficit.     Motor: No weakness.     Coordination: Coordination normal.     Gait: Gait normal.  Psychiatric:        Mood and Affect: Mood normal.        Behavior: Behavior normal.        Thought Content: Thought content normal.        Judgment: Judgment normal.     Labs reviewed: No results for input(s): NA, K, CL, CO2, GLUCOSE, BUN, CREATININE, CALCIUM, MG, PHOS in the last 8760 hours. No results for input(s): AST, ALT, ALKPHOS, BILITOT, PROT, ALBUMIN in the last 8760 hours. No results for input(s): WBC, NEUTROABS, HGB, HCT, MCV, PLT in the last 8760 hours. Lab Results  Component Value Date   TSH 1.590 01/03/2015   No results found for: HGBA1C Lab Results  Component Value Date   CHOL 225 (H) 01/03/2015   HDL 82 01/03/2015   LDLCALC 122 (H) 01/03/2015   TRIG 105 01/03/2015   CHOLHDL 2.7 01/03/2015    Significant Diagnostic Results in last 30 days:  No results found.  Assessment/Plan 1. Essential hypertension B/p elevated on arrival but rechecked went down.Will continue on current medication for now.Dietary and lifestyle modification discussed with patient.Has had 9 lbs weight gain.she will get back to her walking exercise this was impacted from relocation from her house due to separation with husband.she is motivated to change her diet and exercise.  Continue on HCZT 25 mg tablet daily.Will add Losartan if SBP > 140  - CBC with Differential/Platelet; Future - CMP with eGFR([facility]); Future - TSH; Future  2. Hyperlipidemia LDL goal <100 Latest LDL not at goal.Dietary and lifestyle modification discussed.DASH diet information provided during visit.  - Lipid panel; Future  3. Need for Tdap vaccination Over due for Tdap vaccine.Advised to get vaccine at her pharmacy verbalized understanding. - Tdap ([place]) 5-2.5-18.5 LF-MCG/0.5  injection; Inject 0.5 mLs into the muscle once for 1 dose.  Dispense: 0.5 mL; Refill: 0  4. Screening examination for venereal disease Due for screening.Discussed with patient screening for STD's verbalized understanding request labs added to [facility] lab work.  - RPR+HIV+GC+CT Panel; Future  5. Body mass index 28.0-28.9, adult BMI 28.04 dietary and lifestyle modification discussed.DASH diet information provided on AVS.  6. Overweight  BMI 28.04 has had a  9 lbs weight gain over three months ago.she will restarted her exercises and dietary changes.   Family/ staff Communication: Reviewed plan of care with patient verbalized understanding.   Labs/tests ordered:  - CBC with Differential/Platelet; Future - CMP with eGFR([facility]); Future - TSH; Future - Lipid panel; Future - RPR+HIV+GC+CT Panel; Future  Next Appointment : 4 months for medical management of chronic issues. Fasting Labs in the morning or 1 week.   Sandrea Hughs, NP

## 2020-01-23 NOTE — Patient Instructions (Signed)
DASH Eating Plan DASH stands for "Dietary Approaches to Stop Hypertension." The DASH eating plan is a healthy eating plan that has been shown to reduce high blood pressure (hypertension). It may also reduce your risk for type 2 diabetes, heart disease, and stroke. The DASH eating plan may also help with weight loss. What are tips for following this plan?  General guidelines  Avoid eating more than 2,300 mg (milligrams) of salt (sodium) a day. If you have hypertension, you may need to reduce your sodium intake to 1,500 mg a day.  Limit alcohol intake to no more than 1 drink a day for nonpregnant women and 2 drinks a day for men. One drink equals 12 oz of beer, 5 oz of wine, or 1 oz of hard liquor.  Work with your health care provider to maintain a healthy body weight or to lose weight. Ask what an ideal weight is for you.  Get at least 30 minutes of exercise that causes your heart to beat faster (aerobic exercise) most days of the week. Activities may include walking, swimming, or biking.  Work with your health care provider or diet and nutrition specialist (dietitian) to adjust your eating plan to your individual calorie needs. Reading food labels   Check food labels for the amount of sodium per serving. Choose foods with less than 5 percent of the Daily Value of sodium. Generally, foods with less than 300 mg of sodium per serving fit into this eating plan.  To find whole grains, look for the word "whole" as the first word in the ingredient list. Shopping  Buy products labeled as "low-sodium" or "no salt added."  Buy fresh foods. Avoid canned foods and premade or frozen meals. Cooking  Avoid adding salt when cooking. Use salt-free seasonings or herbs instead of table salt or sea salt. Check with your health care provider or pharmacist before using salt substitutes.  Do not fry foods. Cook foods using healthy methods such as baking, boiling, grilling, and broiling instead.  Cook with  heart-healthy oils, such as olive, canola, soybean, or sunflower oil. Meal planning  Eat a balanced diet that includes: ? 5 or more servings of fruits and vegetables each day. At each meal, try to fill half of your plate with fruits and vegetables. ? Up to 6-8 servings of whole grains each day. ? Less than 6 oz of lean meat, poultry, or fish each day. A 3-oz serving of meat is about the same size as a deck of cards. One egg equals 1 oz. ? 2 servings of low-fat dairy each day. ? A serving of nuts, seeds, or beans 5 times each week. ? Heart-healthy fats. Healthy fats called Omega-3 fatty acids are found in foods such as flaxseeds and coldwater fish, like sardines, salmon, and mackerel.  Limit how much you eat of the following: ? Canned or prepackaged foods. ? Food that is high in trans fat, such as fried foods. ? Food that is high in saturated fat, such as fatty meat. ? Sweets, desserts, sugary drinks, and other foods with added sugar. ? Full-fat dairy products.  Do not salt foods before eating.  Try to eat at least 2 vegetarian meals each week.  Eat more home-cooked food and less restaurant, buffet, and fast food.  When eating at a restaurant, ask that your food be prepared with less salt or no salt, if possible. What foods are recommended? The items listed may not be a complete list. Talk with your dietitian about   what dietary choices are best for you. Grains Whole-grain or whole-wheat bread. Whole-grain or whole-wheat pasta. [place] rice. Oatmeal. Quinoa. Bulgur. Whole-grain and low-sodium cereals. Pita bread. Low-fat, low-sodium crackers. Whole-wheat flour tortillas. Vegetables Fresh or frozen vegetables (raw, steamed, roasted, or grilled). Low-sodium or reduced-sodium tomato and vegetable juice. Low-sodium or reduced-sodium tomato sauce and tomato paste. Low-sodium or reduced-sodium canned vegetables. Fruits All fresh, dried, or frozen fruit. Canned fruit in natural juice (without  added sugar). Meat and other protein foods Skinless chicken or turkey. Ground chicken or turkey. Pork with fat trimmed off. Fish and seafood. Egg whites. Dried beans, peas, or lentils. Unsalted nuts, nut butters, and seeds. Unsalted canned beans. Lean cuts of beef with fat trimmed off. Low-sodium, lean deli meat. Dairy Low-fat (1%) or fat-free (skim) milk. Fat-free, low-fat, or reduced-fat cheeses. Nonfat, low-sodium ricotta or cottage cheese. Low-fat or nonfat yogurt. Low-fat, low-sodium cheese. Fats and oils Soft margarine without trans fats. Vegetable oil. Low-fat, reduced-fat, or light mayonnaise and salad dressings (reduced-sodium). Canola, safflower, olive, soybean, and sunflower oils. Avocado. Seasoning and other foods Herbs. Spices. Seasoning mixes without salt. Unsalted popcorn and pretzels. Fat-free sweets. What foods are not recommended? The items listed may not be a complete list. Talk with your dietitian about what dietary choices are best for you. Grains Baked goods made with fat, such as croissants, muffins, or some breads. Dry pasta or rice meal packs. Vegetables Creamed or fried vegetables. Vegetables in a cheese sauce. Regular canned vegetables (not low-sodium or reduced-sodium). Regular canned tomato sauce and paste (not low-sodium or reduced-sodium). Regular tomato and vegetable juice (not low-sodium or reduced-sodium). Pickles. Olives. Fruits Canned fruit in a light or heavy syrup. Fried fruit. Fruit in cream or butter sauce. Meat and other protein foods Fatty cuts of meat. Ribs. Fried meat. Bacon. Sausage. Bologna and other processed lunch meats. Salami. Fatback. Hotdogs. Bratwurst. Salted nuts and seeds. Canned beans with added salt. Canned or smoked fish. Whole eggs or egg yolks. Chicken or turkey with skin. Dairy Whole or 2% milk, cream, and half-and-half. Whole or full-fat cream cheese. Whole-fat or sweetened yogurt. Full-fat cheese. Nondairy creamers. Whipped toppings.  Processed cheese and cheese spreads. Fats and oils Butter. Stick margarine. Lard. Shortening. Ghee. Bacon fat. Tropical oils, such as coconut, palm kernel, or palm oil. Seasoning and other foods Salted popcorn and pretzels. Onion salt, garlic salt, seasoned salt, table salt, and sea salt. Worcestershire sauce. Tartar sauce. Barbecue sauce. Teriyaki sauce. Soy sauce, including reduced-sodium. Steak sauce. Canned and packaged gravies. Fish sauce. Oyster sauce. Cocktail sauce. Horseradish that you find on the shelf. Ketchup. Mustard. Meat flavorings and tenderizers. Bouillon cubes. Hot sauce and Tabasco sauce. Premade or packaged marinades. Premade or packaged taco seasonings. Relishes. Regular salad dressings. Where to find more information:  National Heart, Lung, and Blood Institute: www.nhlbi.nih.gov  American Heart Association: www.heart.org Summary  The DASH eating plan is a healthy eating plan that has been shown to reduce high blood pressure (hypertension). It may also reduce your risk for type 2 diabetes, heart disease, and stroke.  With the DASH eating plan, you should limit salt (sodium) intake to 2,300 mg a day. If you have hypertension, you may need to reduce your sodium intake to 1,500 mg a day.  When on the DASH eating plan, aim to eat more fresh fruits and vegetables, whole grains, lean proteins, low-fat dairy, and heart-healthy fats.  Work with your health care provider or diet and nutrition specialist (dietitian) to adjust your eating plan to your   individual calorie needs. This information is not intended to replace advice given to you by your health care provider. Make sure you discuss any questions you have with your health care provider. Document Revised: 10/23/2017 Document Reviewed: 11/03/2016 Elsevier Patient Education  2020 Elsevier Inc.  

## 2020-01-24 ENCOUNTER — Other Ambulatory Visit: Payer: 59

## 2020-01-24 DIAGNOSIS — Z113 Encounter for screening for infections with a predominantly sexual mode of transmission: Secondary | ICD-10-CM

## 2020-01-24 DIAGNOSIS — E785 Hyperlipidemia, unspecified: Secondary | ICD-10-CM

## 2020-01-24 DIAGNOSIS — I10 Essential (primary) hypertension: Secondary | ICD-10-CM

## 2020-01-24 NOTE — Addendum Note (Signed)
Addended by: Despina Hidden on: 01/24/2020 09:31 AM   Modules accepted: Orders

## 2020-01-25 ENCOUNTER — Other Ambulatory Visit: Payer: Self-pay

## 2020-01-25 DIAGNOSIS — R748 Abnormal levels of other serum enzymes: Secondary | ICD-10-CM

## 2020-01-25 LAB — COMPLETE METABOLIC PANEL WITH GFR
AG Ratio: 1.3 (calc) (ref 1.0–2.5)
ALT: 74 U/L — ABNORMAL HIGH (ref 6–29)
AST: 48 U/L — ABNORMAL HIGH (ref 10–35)
Albumin: 3.9 g/dL (ref 3.6–5.1)
Alkaline phosphatase (APISO): 39 U/L (ref 37–153)
BUN: 14 mg/dL (ref 7–25)
CO2: 25 mmol/L (ref 20–32)
Calcium: 9.1 mg/dL (ref 8.6–10.4)
Chloride: 102 mmol/L (ref 98–110)
Creat: 0.76 mg/dL (ref 0.50–1.05)
GFR, Est African American: 105 mL/min/{1.73_m2} (ref >=60)
GFR, Est Non African American: 91 mL/min/{1.73_m2} (ref >=60)
Globulin: 3.1 g/dL (calc) (ref 1.9–3.7)
Glucose, Bld: 78 mg/dL (ref 65–99)
Potassium: 4.1 mmol/L (ref 3.5–5.3)
Sodium: 139 mmol/L (ref 135–146)
Total Bilirubin: 0.9 mg/dL (ref 0.2–1.2)
Total Protein: 7 g/dL (ref 6.1–8.1)

## 2020-01-25 LAB — CBC WITH DIFFERENTIAL/PLATELET
Absolute Monocytes: 249 cells/uL (ref 200–950)
Basophils Absolute: 42 cells/uL (ref 0–200)
Basophils Relative: 0.8 %
Eosinophils Absolute: 42 cells/uL (ref 15–500)
Eosinophils Relative: 0.8 %
HCT: 40.4 % (ref 35.0–45.0)
Hemoglobin: 13.6 g/dL (ref 11.7–15.5)
Lymphs Abs: 1945 cells/uL (ref 850–3900)
MCH: 32.3 pg (ref 27.0–33.0)
MCHC: 33.7 g/dL (ref 32.0–36.0)
MCV: 96 fL (ref 80.0–100.0)
MPV: 8.9 fL (ref 7.5–12.5)
Monocytes Relative: 4.7 %
Neutro Abs: 3021 cells/uL (ref 1500–7800)
Neutrophils Relative %: 57 %
Platelets: 393 10*3/uL (ref 140–400)
RBC: 4.21 10*6/uL (ref 3.80–5.10)
RDW: 11.9 % (ref 11.0–15.0)
Total Lymphocyte: 36.7 %
WBC: 5.3 10*3/uL (ref 3.8–10.8)

## 2020-01-25 LAB — HIV ANTIBODY (ROUTINE TESTING W REFLEX): HIV 1&2 Ab, 4th Generation: NONREACTIVE

## 2020-01-25 LAB — RPR: RPR Ser Ql: NONREACTIVE

## 2020-01-25 LAB — LIPID PANEL
Cholesterol: 223 mg/dL — ABNORMAL HIGH (ref <200)
HDL: 75 mg/dL (ref >=50)
LDL Cholesterol (Calc): 130 mg/dL (calc) — ABNORMAL HIGH
Non-HDL Cholesterol (Calc): 148 mg/dL (calc) — ABNORMAL HIGH (ref <130)
Total CHOL/HDL Ratio: 3 (calc) (ref <5.0)
Triglycerides: 81 mg/dL (ref <150)

## 2020-01-25 LAB — TSH: TSH: 1.29 mIU/L

## 2020-01-25 LAB — C. TRACHOMATIS/N. GONORRHOEAE RNA
C. trachomatis RNA, TMA: NOT DETECTED
N. gonorrhoeae RNA, TMA: NOT DETECTED

## 2020-02-19 ENCOUNTER — Other Ambulatory Visit: Payer: Self-pay | Admitting: Family

## 2020-02-22 ENCOUNTER — Other Ambulatory Visit: Payer: Self-pay

## 2020-02-22 ENCOUNTER — Other Ambulatory Visit: Payer: 59

## 2020-02-22 DIAGNOSIS — R748 Abnormal levels of other serum enzymes: Secondary | ICD-10-CM

## 2020-02-22 LAB — BASIC METABOLIC PANEL WITH GFR
BUN: 18 mg/dL (ref 7–25)
CO2: 31 mmol/L (ref 20–32)
Calcium: 9.3 mg/dL (ref 8.6–10.4)
Chloride: 101 mmol/L (ref 98–110)
Creat: 0.98 mg/dL (ref 0.50–1.05)
GFR, Est African American: 77 mL/min/{1.73_m2} (ref >=60)
GFR, Est Non African American: 67 mL/min/{1.73_m2} (ref >=60)
Glucose, Bld: 87 mg/dL (ref 65–99)
Potassium: 3.9 mmol/L (ref 3.5–5.3)
Sodium: 138 mmol/L (ref 135–146)

## 2020-02-22 LAB — HEPATIC FUNCTION PANEL
AG Ratio: 1.6 (calc) (ref 1.0–2.5)
ALT: 47 U/L — ABNORMAL HIGH (ref 6–29)
AST: 33 U/L (ref 10–35)
Albumin: 4.2 g/dL (ref 3.6–5.1)
Alkaline phosphatase (APISO): 40 U/L (ref 37–153)
Bilirubin, Direct: 0.1 mg/dL (ref 0.0–0.2)
Globulin: 2.7 g/dL (calc) (ref 1.9–3.7)
Indirect Bilirubin: 0.4 mg/dL (calc) (ref 0.2–1.2)
Total Bilirubin: 0.5 mg/dL (ref 0.2–1.2)
Total Protein: 6.9 g/dL (ref 6.1–8.1)

## 2020-05-22 ENCOUNTER — Ambulatory Visit: Payer: 59 | Admitting: Family

## 2020-05-24 ENCOUNTER — Ambulatory Visit: Payer: 59 | Admitting: Family

## 2020-06-19 ENCOUNTER — Ambulatory Visit: Payer: 59 | Admitting: Family

## 2020-08-14 ENCOUNTER — Ambulatory Visit: Payer: 59 | Admitting: Family

## 2020-08-19 ENCOUNTER — Other Ambulatory Visit: Payer: Self-pay | Admitting: Family

## 2020-09-27 ENCOUNTER — Other Ambulatory Visit: Payer: Self-pay

## 2020-09-27 ENCOUNTER — Encounter: Payer: Self-pay | Admitting: Adult Health

## 2020-09-27 ENCOUNTER — Ambulatory Visit (INDEPENDENT_AMBULATORY_CARE_PROVIDER_SITE_OTHER): Payer: 59 | Admitting: Adult Health

## 2020-09-27 VITALS — BP 115/70 | HR 77 | Temp 97.3°F | Ht 67.0 in | Wt 179.0 lb

## 2020-09-27 DIAGNOSIS — J029 Acute pharyngitis, unspecified: Secondary | ICD-10-CM | POA: Diagnosis not present

## 2020-09-27 LAB — POCT INFLUENZA A/B
Influenza A, POC: NEGATIVE
Influenza B, POC: NEGATIVE

## 2020-09-27 MED ORDER — ZINC SULFATE 220 (50 ZN) MG PO CAPS: 220.0000 mg | ORAL_CAPSULE | Freq: Every day | ORAL | 0 refills | Status: DC

## 2020-09-27 MED ORDER — ASPIRIN EC 81 MG PO TBEC: 81.0000 mg | DELAYED_RELEASE_TABLET | Freq: Every day | ORAL | 0 refills | Status: AC

## 2020-09-27 MED ORDER — AZITHROMYCIN 250 MG PO TABS: ORAL_TABLET | ORAL | 0 refills | Status: AC

## 2020-09-27 NOTE — Progress Notes (Signed)
[facility] clinic  Provider:   Code Status:  Full Code    Chief Complaint  Patient presents with  . Acute Visit    Productive cough, was [place] and clumpy, now green/yellow, sore throat which is milder, runny nose     HPI: Patient is a 54 y.o. female seen today for an acute visit for sore throat which started 6 days ago, cough with [place] phlegm and runny nose.  A day ago, has phlegm became yellowish greenish. She denies fever, chills nor shortness of breath. She has good appetite and has been trying to increase her fluid intake. She is fully vaccinated with Pfizer COVID-19 2/2 vaccine. She was tested for Influenza A/B and was negative.  She was also tested for COVID-19. She denies having a known exposure to a sick family member. She works at home.   Past Medical History:  Diagnosis Date  . Anemia   . Hypertension     Past Surgical History:  Procedure Laterality Date  . ABDOMINAL HYSTERECTOMY Bilateral 11/29/2015   Procedure: HYSTERECTOMY ABDOMINAL;  Surgeon: Molli Posey, MD;  Location: [facility];  Service: Gynecology;  Laterality: Bilateral;  . BILATERAL SALPINGECTOMY Bilateral 11/29/2015   Procedure: BILATERAL SALPINGECTOMY;  Surgeon: Molli Posey, MD;  Location: [facility];  Service: Gynecology;  Laterality: Bilateral;  . BREAST SURGERY  2007   reduction, Cristine Polio, MD  . VAGINAL DELIVERY    . WISDOM TOOTH EXTRACTION Bilateral age - early 28s   all 2 removed    No Known Allergies  Outpatient Encounter Medications as of 09/27/2020  Medication Sig  . hydrochlorothiazide (HYDRODIURIL) 25 MG tablet TAKE 1 TABLET BY MOUTH EVERY DAY  . ibuprofen (ADVIL,MOTRIN) 200 MG tablet Take 200 mg by mouth every 6 (six) hours as needed.  . Multiple Vitamin (MULTIVITAMIN) tablet Take 1 tablet by mouth daily.  . valACYclovir (VALTREX) 500 MG tablet Take 500 mg by mouth daily.   No facility-administered encounter medications on file as of 09/27/2020.    Review of Systems:  Review of  Systems  Constitutional: Negative for activity change, appetite change, chills and fever.  HENT: Positive for congestion. Negative for mouth sores.   Respiratory: Negative for chest tightness, shortness of breath and wheezing.   Gastrointestinal: Negative for abdominal pain and diarrhea.  Genitourinary: Negative for difficulty urinating and dysuria.  Musculoskeletal: Negative for neck pain and neck stiffness.  Skin: Negative.  Negative for color change.  Neurological: Negative for dizziness and headaches.  Psychiatric/Behavioral: Negative.     Health Maintenance  Topic Date Due  . Hepatitis C Screening  Never done  . TETANUS/TDAP  Never done  . INFLUENZA VACCINE  06/24/2020  . MAMMOGRAM  06/13/2021  . COLONOSCOPY  11/08/2022  . COVID-19 Vaccine  Completed  . HIV Screening  Completed  . PAP SMEAR-Modifier  Discontinued    Physical Exam: Vitals:   09/27/20 1512  BP: 115/70  Pulse: 77  Temp: (!) 97.3 F (36.3 C)  TempSrc: Temporal  SpO2: 99%  Weight: 179 lb (81.2 kg)  Height: 5\' 7"  (1.702 m)   Body mass index is 28.04 kg/m. Physical Exam Constitutional:      Appearance: Normal appearance.  HENT:     Nose: Nose normal. No rhinorrhea.     Mouth/Throat:     Mouth: Mucous membranes are moist.  Cardiovascular:     Rate and Rhythm: Normal rate and regular rhythm.     Pulses: Normal pulses.     Heart sounds: Normal heart sounds.  Musculoskeletal:     Cervical back: Normal range of motion.  Neurological:     Mental Status: She is alert.     Labs reviewed: Basic Metabolic Panel: Recent Labs    01/24/20 0903 02/22/20 1539  NA 139 138  K 4.1 3.9  CL 102 101  CO2 25 31  GLUCOSE 78 87  BUN 14 18  CREATININE 0.76 0.98  CALCIUM 9.1 9.3  TSH 1.29  --    Liver Function Tests: Recent Labs    01/24/20 0903 02/22/20 1539  AST 48* 33  ALT 74* 47*  BILITOT 0.9 0.5  PROT 7.0 6.9   CBC: Recent Labs    01/24/20 0903  WBC 5.3  NEUTROABS 3,021  HGB 13.6  HCT  40.4  MCV 96.0  PLT 393   Lipid Panel: Recent Labs    01/24/20 0903  CHOL 223*  HDL 75  LDLCALC 130*  TRIG 81  CHOLHDL 3.0     Assessment/Plan  1. Pharyngitis, unspecified etiology -  Instructed to gargle with warm salty water and to increase fluid intake - azithromycin (ZITHROMAX) 250 MG tablet; 1 tab orally twice a day X 10 days  Dispense: 20 tablet; Refill: 0 - zinc sulfate 220 (50 Zn) MG capsule; Take 1 capsule (220 mg total) by mouth daily.  Dispense: 10 capsule; Refill: 0 - aspirin EC 81 MG tablet; Take 1 tablet (81 mg total) by mouth daily for 10 days. Swallow whole 1 tab with food daily  Dispense: 10 tablet; Refill: 0 - POC Influenza A/B - SARS-COV-2 RNA,(COVID-19) QUAL NAAT    Labs/tests ordered:  Influenza A/B, COVID-19  Next appt:  10/12/2020

## 2020-09-27 NOTE — Patient Instructions (Signed)
Pharyngitis  Pharyngitis is a sore throat (pharynx). This is when there is redness, pain, and swelling in your throat. Most of the time, this condition gets better on its own. In some cases, you may need medicine. Follow these instructions at home:  Take over-the-counter and prescription medicines only as told by your doctor. ? If you were prescribed an antibiotic medicine, take it as told by your doctor. Do not stop taking the antibiotic even if you start to feel better. ? Do not give children aspirin. Aspirin has been linked to Reye syndrome.  Drink enough water and fluids to keep your pee (urine) clear or pale yellow.  Get a lot of rest.  Rinse your mouth (gargle) with a salt-water mixture 3-4 times a day or as needed. To make a salt-water mixture, completely dissolve -1 tsp of salt in 1 cup of warm water.  If your doctor approves, you may use throat lozenges or sprays to soothe your throat. Contact a doctor if:  You have large, tender lumps in your neck.  You have a rash.  You cough up green, yellow-Hayden, or bloody spit. Get help right away if:  You have a stiff neck.  You drool or cannot swallow liquids.  You cannot drink or take medicines without throwing up.  You have very bad pain that does not go away with medicine.  You have problems breathing, and it is not from a stuffy nose.  You have new pain and swelling in your knees, ankles, wrists, or elbows. Summary  Pharyngitis is a sore throat (pharynx). This is when there is redness, pain, and swelling in your throat.  If you were prescribed an antibiotic medicine, take it as told by your doctor. Do not stop taking the antibiotic even if you start to feel better.  Most of the time, pharyngitis gets better on its own. Sometimes, you may need medicine. This information is not intended to replace advice given to you by your health care provider. Make sure you discuss any questions you have with your health care  provider. Document Revised: 10/23/2017 Document Reviewed: 12/16/2016 Elsevier Patient Education  2020 [employer].

## 2020-09-28 LAB — SARS-COV-2 RNA,(COVID-19) QUALITATIVE NAAT: SARS CoV2 RNA: NOT DETECTED

## 2020-10-12 ENCOUNTER — Encounter: Payer: 59 | Admitting: Family

## 2020-10-22 ENCOUNTER — Encounter: Payer: Self-pay | Admitting: Family

## 2020-10-22 ENCOUNTER — Other Ambulatory Visit: Payer: Self-pay

## 2020-10-22 ENCOUNTER — Ambulatory Visit (INDEPENDENT_AMBULATORY_CARE_PROVIDER_SITE_OTHER): Payer: 59 | Admitting: Family

## 2020-10-22 VITALS — BP 126/82 | HR 71 | Temp 97.7°F | Resp 16 | Ht 67.0 in | Wt 189.4 lb

## 2020-10-22 DIAGNOSIS — Z23 Encounter for immunization: Secondary | ICD-10-CM | POA: Diagnosis not present

## 2020-10-22 DIAGNOSIS — Z0000 Encounter for general adult medical examination without abnormal findings: Secondary | ICD-10-CM | POA: Diagnosis not present

## 2020-10-22 DIAGNOSIS — Z6829 Body mass index (BMI) 29.0-29.9, adult: Secondary | ICD-10-CM

## 2020-10-22 DIAGNOSIS — E663 Overweight: Secondary | ICD-10-CM

## 2020-10-22 DIAGNOSIS — I10 Essential (primary) hypertension: Secondary | ICD-10-CM

## 2020-10-22 DIAGNOSIS — Z1159 Encounter for screening for other viral diseases: Secondary | ICD-10-CM

## 2020-10-22 MED ORDER — TETANUS-DIPHTH-ACELL PERTUSSIS 5-2-15.5 LF-MCG/0.5 IM SUSP: 0.5000 mL | Freq: Once | INTRAMUSCULAR | 0 refills | Status: AC

## 2020-10-22 NOTE — Progress Notes (Signed)
Provider: Marlowe Sax FNP-C   [name], Nelda Bucks, NP  Patient Care Team: Xareni Kelch, Nelda Bucks, NP as PCP - General (Family Medicine) [name], MD as Consulting Physician (Orthopedic Surgery) Molli Posey, MD as Consulting Physician (Obstetrics and Gynecology)  Extended Emergency Contact Information Primary Emergency Contact: [place] Mobile Phone: [phone] Relation: Sister  Code Status:Full Code  Goals of care: Advanced Directive information Advanced Directives 10/22/2020  Does Patient Have a Medical Advance Directive? No  Does patient want to make changes to medical advance directive? No - Patient declined  Would patient like information on creating a medical advance directive? -     Chief Complaint  Patient presents with   Annual Exam    Yearly Physical   Health Maintenance    Discuss the need for Hepatitis C Screening.   Immunizations    Discuss the need for Tetanus Vaccine.    HPI:  Pt is a 54 y.o. female seen today for Annual Physical Exam and medical management of chronic diseases.she denies any acute issues during visit.Her blood pressure well controlled this visit.does not check her blood pressure at home but states her mother has a B/p cuff that she can use.she denies any signs of hypotension. Has had her COVID-19 vaccine including the booster. Recently had her influenza vaccine. She is due for Tdap vaccine it was ordered on her last physical but did not get it at her pharmacy unclear reason.  Also due for Hep C screening.she is low risk.  Continues to follow up with dental and Gynecology.  Had elevated AFT's on 01/24/2020.states drinks beer just once in a while like during holidays. States due to COVID-19 now working from home.   Past Medical History:  Diagnosis Date   Anemia    Hypertension    Past Surgical History:  Procedure Laterality Date   ABDOMINAL HYSTERECTOMY Bilateral 11/29/2015   Procedure: HYSTERECTOMY ABDOMINAL;  Surgeon:  Molli Posey, MD;  Location: [facility];  Service: Gynecology;  Laterality: Bilateral;   BILATERAL SALPINGECTOMY Bilateral 11/29/2015   Procedure: BILATERAL SALPINGECTOMY;  Surgeon: Molli Posey, MD;  Location: [facility];  Service: Gynecology;  Laterality: Bilateral;   BREAST SURGERY  2007   reduction, Cristine Polio, MD   VAGINAL DELIVERY     WISDOM TOOTH EXTRACTION Bilateral age - early 66s   all 59 removed    No Known Allergies  Allergies as of 10/22/2020   No Known Allergies     Medication List       Accurate as of October 22, 2020  4:20 PM. If you have any questions, ask your nurse or doctor.        hydrochlorothiazide 25 MG tablet Commonly known as: HYDRODIURIL TAKE 1 TABLET BY MOUTH EVERY DAY   ibuprofen 200 MG tablet Commonly known as: ADVIL Take 200 mg by mouth every 6 (six) hours as needed.   multivitamin tablet Take 1 tablet by mouth daily.   valACYclovir 500 MG tablet Commonly known as: VALTREX Take 500 mg by mouth daily.   zinc sulfate 220 (50 Zn) MG capsule Take 1 capsule (220 mg total) by mouth daily.       Review of Systems  Constitutional: Negative for appetite change, chills, fatigue, fever and unexpected weight change.  HENT: Negative for congestion, dental problem, hearing loss, postnasal drip, rhinorrhea, sinus pressure, sinus pain, sneezing, sore throat and trouble swallowing.   Eyes: Positive for visual disturbance. Negative for pain, discharge, redness and itching.       Follows up with  Ophthalmology   Respiratory: Negative for cough, chest tightness, shortness of breath and wheezing.   Cardiovascular: Negative for chest pain, palpitations and leg swelling.  Gastrointestinal: Negative for abdominal distention, abdominal pain, constipation, diarrhea, nausea and vomiting.  Endocrine: Negative for cold intolerance, heat intolerance, polydipsia, polyphagia and polyuria.  Genitourinary: Negative for difficulty urinating, flank pain, frequency  and urgency.  Musculoskeletal: Negative for arthralgias, back pain, gait problem, joint swelling and myalgias.  Skin: Negative for color change, pallor, rash and wound.  Neurological: Negative for dizziness, seizures, speech difficulty, weakness, light-headedness, numbness and headaches.  Hematological: Does not bruise/bleed easily.  Psychiatric/Behavioral: Negative for agitation, behavioral problems, confusion and sleep disturbance. The patient is not nervous/anxious.     Immunization History  Administered Date(s) Administered   Influenza Inj Mdck Quad Pf 09/19/2017   Influenza, Quadrivalent, Recombinant, Inj, Pf 09/24/2017   Influenza,inj,Quad PF,6+ Mos 11/30/2015, 01/14/2019, 07/20/2019   Influenza-Unspecified 09/07/2014, 10/09/2020   PFIZER SARS-COV-2 Vaccination 05/23/2020, 06/23/2020, 10/09/2020   Pertinent  Health Maintenance Due  Topic Date Due   MAMMOGRAM  06/13/2021   COLONOSCOPY  11/08/2022   INFLUENZA VACCINE  Completed   PAP SMEAR-Modifier  Discontinued   Fall Risk  10/22/2020 09/27/2020 01/23/2020 03/16/2018 01/26/2018  Falls in the past year? 0 0 0 No No  Number falls in past yr: 0 - 0 - -  Injury with Fall? 0 - 0 - -   Functional Status Survey:    Vitals:   10/22/20 1548  BP: 126/82  Pulse: 71  Resp: 16  Temp: 97.7 F (36.5 C)  SpO2: 98%  Weight: 189 lb 6.4 oz (85.9 kg)  Height: 5\' 7"  (1.702 m)   Body mass index is 29.66 kg/m. Physical Exam Vitals reviewed.  Constitutional:      General: She is not in acute distress.    Appearance: She is overweight. She is not ill-appearing.  HENT:     Head: Normocephalic.     Right Ear: Tympanic membrane, ear canal and external ear normal. There is no impacted cerumen.     Left Ear: Tympanic membrane, ear canal and external ear normal. There is no impacted cerumen.     Nose: Nose normal. No congestion or rhinorrhea.     Mouth/Throat:     Mouth: Mucous membranes are moist.     Pharynx: Oropharynx is clear.  No oropharyngeal exudate or posterior oropharyngeal erythema.  Eyes:     General: No scleral icterus.       Right eye: No discharge.        Left eye: No discharge.     Conjunctiva/sclera: Conjunctivae normal.     Pupils: Pupils are equal, round, and reactive to light.  Neck:     Vascular: No carotid bruit.  Cardiovascular:     Rate and Rhythm: Normal rate and regular rhythm.     Pulses: Normal pulses.     Heart sounds: Normal heart sounds. No murmur heard.  No friction rub. No gallop.   Pulmonary:     Effort: Pulmonary effort is normal. No respiratory distress.     Breath sounds: Normal breath sounds. No wheezing, rhonchi or rales.  Chest:     Chest wall: No tenderness.  Abdominal:     General: Bowel sounds are normal. There is no distension.     Palpations: Abdomen is soft. There is no mass.     Tenderness: There is no abdominal tenderness. There is no right CVA tenderness, left CVA tenderness, guarding or rebound.  Musculoskeletal:  General: No swelling or tenderness. Normal range of motion.     Cervical back: Normal range of motion. No rigidity or tenderness.     Right lower leg: No edema.     Left lower leg: No edema.  Lymphadenopathy:     Cervical: No cervical adenopathy.  Skin:    General: Skin is warm and dry.     Coloration: Skin is not pale.     Findings: No bruising, erythema or rash.  Neurological:     Mental Status: She is alert and oriented to person, place, and time.     Cranial Nerves: No cranial nerve deficit.     Sensory: No sensory deficit.     Motor: No weakness.     Coordination: Coordination normal.     Gait: Gait normal.     Deep Tendon Reflexes: Reflexes normal.  Psychiatric:        Mood and Affect: Mood normal.        Behavior: Behavior normal.        Thought Content: Thought content normal.        Judgment: Judgment normal.    Labs reviewed: Recent Labs    01/24/20 0903 02/22/20 1539  NA 139 138  K 4.1 3.9  CL 102 101  CO2 25 31   GLUCOSE 78 87  BUN 14 18  CREATININE 0.76 0.98  CALCIUM 9.1 9.3   Recent Labs    01/24/20 0903 02/22/20 1539  AST 48* 33  ALT 74* 47*  BILITOT 0.9 0.5  PROT 7.0 6.9   Recent Labs    01/24/20 0903  WBC 5.3  NEUTROABS 3,021  HGB 13.6  HCT 40.4  MCV 96.0  PLT 393   Lab Results  Component Value Date   TSH 1.29 01/24/2020   No results found for: HGBA1C Lab Results  Component Value Date   CHOL 223 (H) 01/24/2020   HDL 75 01/24/2020   LDLCALC 130 (H) 01/24/2020   TRIG 81 01/24/2020   CHOLHDL 3.0 01/24/2020    Significant Diagnostic Results in last 30 days:  No results found.  Assessment/Plan 1. Annual physical exam Annual Physical Exam  Up to date with immunization. Medication and labs reviewed patient counselled regarding yearly exam, prevention of dental and periodontal disease, diet, regular sustained exercise for at least 30 minutes x 3 /week,COVID-19 hand hygiene, mask and social distancing per CDC guidelines.recommended schedule for routine labs.Not fasting today will schedule appointment for fasting lab work this week. Fall screening.Negative for depression.  - CBC with Differential; Future - Comprehensive metabolic panel; Future - Lipid panel; Future - TSH; Future - Hepatitis C antibody screen; Future  2. Need for Tdap vaccination Advised to get Tdap vaccine at her pharmacy.  - Tdap (ADACEL) 03-25-14.5 LF-MCG/0.5 injection; Inject 0.5 mLs into the muscle once for 1 dose.  Dispense: 0.5 mL; Refill: 0  3. Encounter for hepatitis C screening test for low risk patient Low risk. - Hep C antibody screening,Future   4. Essential hypertension B/p well controlled. - encouraged to check B/p at home and record.Notify provider if B/p > 140/90  - continue on HZCT 12.5 mg tablet daily will discontinue if B/p remains stable.   5. Body mass index 29.0-29.9, adult BMI 29.66 - Dietary modification and exercise advised.   6. Overweight with body mass index (BMI) of  29 to 29.9 in adult Dietary modification and exercise advised as above  - Lipid panel; Future - TSH; Future    Family/ staff  Communication: Reviewed plan of care with patient  Labs/tests ordered:  - CBC with Differential; Future - Comprehensive metabolic panel; Future - Lipid panel; Future - TSH; Future - Hepatitis C antibody screen; Future  Next Appointment : 1 year   Sandrea Hughs, NP

## 2020-10-22 NOTE — Patient Instructions (Signed)
-   check your blood pressure daily and record Notifiy provider if > 140/90

## 2020-10-24 ENCOUNTER — Encounter: Payer: Self-pay | Admitting: Family

## 2020-10-24 ENCOUNTER — Other Ambulatory Visit: Payer: Self-pay

## 2020-10-24 ENCOUNTER — Ambulatory Visit (INDEPENDENT_AMBULATORY_CARE_PROVIDER_SITE_OTHER): Payer: 59 | Admitting: Family

## 2020-10-24 VITALS — BP 110/80 | HR 73 | Temp 97.3°F | Resp 16 | Ht 67.0 in | Wt 189.2 lb

## 2020-10-24 DIAGNOSIS — H1013 Acute atopic conjunctivitis, bilateral: Secondary | ICD-10-CM | POA: Diagnosis not present

## 2020-10-24 DIAGNOSIS — J3489 Other specified disorders of nose and nasal sinuses: Secondary | ICD-10-CM | POA: Diagnosis not present

## 2020-10-24 MED ORDER — OLOPATADINE HCL 0.1 % OP SOLN: 1.0000 [drp] | Freq: Two times a day (BID) | OPHTHALMIC | 0 refills | Status: AC

## 2020-10-24 MED ORDER — LORATADINE 10 MG PO TABS: 10.0000 mg | ORAL_TABLET | Freq: Every day | ORAL | 0 refills | Status: DC

## 2020-10-24 NOTE — Progress Notes (Signed)
Provider: Marlowe Sax FNP-C  Lorieann Argueta, Nelda Bucks, NP  Patient Care Team: Deshonna Trnka, Nelda Bucks, NP as PCP - General (Family Medicine) [name], MD as Consulting Physician (Orthopedic Surgery) Molli Posey, MD as Consulting Physician (Obstetrics and Gynecology)  Extended Emergency Contact Information Primary Emergency Contact: [place] Mobile Phone: [phone] Relation: Sister  Code Status:  Full Code  Goals of care: Advanced Directive information Advanced Directives 10/24/2020  Does Patient Have a Medical Advance Directive? No  Does patient want to make changes to medical advance directive? No - Patient declined  Would patient like information on creating a medical advance directive? -     Chief Complaint  Patient presents with  . Acute Visit    Right eye pain and discharge since yesterday.    HPI:  Pt is a 54 y.o. female seen today for an acute visit for evaluation of right eye pain and crusty drainage.eye is sensitivity to light.symptoms worst last night. Had runny nose and watery eyes during the night.Had to use wash cloth to remove crusty stuff on the right eye this morning.also had headache yesterday but none today.States air vent is above her bed and has had heat on " room stays hot" has to shut the door because her sister next door snores. No loss of smell or taste.wears contact lens but wore her eye glasses today.she denies any injuries to eye.Has had no fever or chills.     Past Medical History:  Diagnosis Date  . Anemia   . Hypertension    Past Surgical History:  Procedure Laterality Date  . ABDOMINAL HYSTERECTOMY Bilateral 11/29/2015   Procedure: HYSTERECTOMY ABDOMINAL;  Surgeon: Molli Posey, MD;  Location: [facility];  Service: Gynecology;  Laterality: Bilateral;  . BILATERAL SALPINGECTOMY Bilateral 11/29/2015   Procedure: BILATERAL SALPINGECTOMY;  Surgeon: Molli Posey, MD;  Location: [facility];  Service: Gynecology;  Laterality: Bilateral;  . BREAST  SURGERY  2007   reduction, Cristine Polio, MD  . VAGINAL DELIVERY    . WISDOM TOOTH EXTRACTION Bilateral age - early 33s   all 33 removed    No Known Allergies  Outpatient Encounter Medications as of 10/24/2020  Medication Sig  . hydrochlorothiazide (HYDRODIURIL) 25 MG tablet TAKE 1 TABLET BY MOUTH EVERY DAY  . ibuprofen (ADVIL,MOTRIN) 200 MG tablet Take 200 mg by mouth every 6 (six) hours as needed.  . Multiple Vitamin (MULTIVITAMIN) tablet Take 1 tablet by mouth daily.  . valACYclovir (VALTREX) 500 MG tablet Take 500 mg by mouth daily.  Marland Kitchen zinc sulfate 220 (50 Zn) MG capsule Take 1 capsule (220 mg total) by mouth daily.   No facility-administered encounter medications on file as of 10/24/2020.    Review of Systems  Constitutional: Negative for appetite change and chills.  HENT: Positive for rhinorrhea. Negative for congestion, sinus pressure, sinus pain, sneezing and sore throat.   Eyes: Positive for pain, discharge, redness and itching.       Had some cloudiness vision   Respiratory: Negative for cough, chest tightness, shortness of breath and wheezing.   Cardiovascular: Negative for chest pain, palpitations and leg swelling.  Skin: Negative for color change, pallor and rash.  Neurological: Negative for dizziness, light-headedness and headaches.    Immunization History  Administered Date(s) Administered  . Influenza Inj Mdck Quad Pf 09/19/2017  . Influenza, Quadrivalent, Recombinant, Inj, Pf 09/24/2017  . Influenza,inj,Quad PF,6+ Mos 11/30/2015, 01/14/2019, 07/20/2019  . Influenza-Unspecified 09/07/2014, 10/09/2020  . PFIZER SARS-COV-2 Vaccination 05/23/2020, 06/23/2020, 10/09/2020   Pertinent  Health Maintenance  Due  Topic Date Due  . MAMMOGRAM  06/13/2021  . COLONOSCOPY  11/08/2022  . INFLUENZA VACCINE  Completed  . PAP SMEAR-Modifier  Discontinued   Fall Risk  10/24/2020 10/22/2020 09/27/2020 01/23/2020 03/16/2018  Falls in the past year? 0 0 0 0 No  Number falls in  past yr: 0 0 - 0 -  Injury with Fall? 0 0 - 0 -   Functional Status Survey:    Vitals:   10/24/20 1528  BP: 110/80  Pulse: 73  Resp: 16  Temp: (!) 97.3 F (36.3 C)  SpO2: 98%  Weight: 189 lb 3.2 oz (85.8 kg)  Height: 5\' 7"  (1.702 m)   Body mass index is 29.63 kg/m. Physical Exam Vitals reviewed.  Constitutional:      General: She is not in acute distress.    Appearance: She is overweight. She is not ill-appearing.  HENT:     Head: Normocephalic.     Nose: Rhinorrhea present. No congestion.     Mouth/Throat:     Mouth: Mucous membranes are moist.     Pharynx: Oropharynx is clear. No oropharyngeal exudate or posterior oropharyngeal erythema.  Eyes:     General: No scleral icterus.       Right eye: No discharge.        Left eye: No discharge.     Conjunctiva/sclera: Conjunctivae normal.     Pupils: Pupils are equal, round, and reactive to light.  Cardiovascular:     Rate and Rhythm: Normal rate and regular rhythm.     Pulses: Normal pulses.     Heart sounds: Normal heart sounds. No murmur heard.  No friction rub. No gallop.   Pulmonary:     Effort: Pulmonary effort is normal. No respiratory distress.     Breath sounds: Normal breath sounds. No wheezing, rhonchi or rales.  Chest:     Chest wall: No tenderness.  Neurological:     Mental Status: She is alert and oriented to person, place, and time.     Cranial Nerves: No cranial nerve deficit.     Sensory: No sensory deficit.     Motor: No weakness.     Gait: Gait normal.    Labs reviewed: Recent Labs    01/24/20 0903 02/22/20 1539  NA 139 138  K 4.1 3.9  CL 102 101  CO2 25 31  GLUCOSE 78 87  BUN 14 18  CREATININE 0.76 0.98  CALCIUM 9.1 9.3   Recent Labs    01/24/20 0903 02/22/20 1539  AST 48* 33  ALT 74* 47*  BILITOT 0.9 0.5  PROT 7.0 6.9   Recent Labs    01/24/20 0903  WBC 5.3  NEUTROABS 3,021  HGB 13.6  HCT 40.4  MCV 96.0  PLT 393   Lab Results  Component Value Date   TSH 1.29  01/24/2020   No results found for: HGBA1C Lab Results  Component Value Date   CHOL 223 (H) 01/24/2020   HDL 75 01/24/2020   LDLCALC 130 (H) 01/24/2020   TRIG 81 01/24/2020   CHOLHDL 3.0 01/24/2020    Significant Diagnostic Results in last 30 days:  No results found.  Assessment/Plan  1. Allergic conjunctivitis of both eyes Negative eye exam.suspect allergies. - Encouraged to use Humidify at bedtime to keep the air moist. - olopatadine (PATANOL) 0.1 % ophthalmic solution; Place 1 drop into the right eye 2 (two) times daily for 14 days.  Dispense: 5 mL; Refill: 0 - loratadine (CLARITIN)  10 MG tablet; Take 1 tablet (10 mg total) by mouth daily for 14 days.  Dispense: 14 tablet; Refill: 0 - Notify provider if symptoms worsen or fail to improve  2. Rhinorrhea Afebrile. Clear nasal drainage suspect allergies. - Take loratadine as directed below for allergies.  - loratadine (CLARITIN) 10 MG tablet; Take 1 tablet (10 mg total) by mouth daily for 14 days.  Dispense: 14 tablet; Refill: 0  Family/ staff Communication: Reviewed plan of care with patient  Labs/tests ordered: None   Next Appointment: As needed if symptoms worsen or fail to improve.   Sandrea Hughs, NP

## 2020-10-24 NOTE — Patient Instructions (Signed)
-   Notify provider if symptoms worsen or fail to improve  °

## 2020-10-26 ENCOUNTER — Other Ambulatory Visit: Payer: 59

## 2020-10-26 ENCOUNTER — Other Ambulatory Visit: Payer: Self-pay

## 2020-10-26 DIAGNOSIS — Z0000 Encounter for general adult medical examination without abnormal findings: Secondary | ICD-10-CM

## 2020-10-29 LAB — LIPID PANEL
Cholesterol: 220 mg/dL — ABNORMAL HIGH (ref <200)
HDL: 66 mg/dL (ref >=50)
LDL Cholesterol (Calc): 140 mg/dL (calc) — ABNORMAL HIGH
Non-HDL Cholesterol (Calc): 154 mg/dL (calc) — ABNORMAL HIGH (ref <130)
Total CHOL/HDL Ratio: 3.3 (calc) (ref <5.0)
Triglycerides: 45 mg/dL (ref <150)

## 2020-10-29 LAB — CBC WITH DIFFERENTIAL/PLATELET
Absolute Monocytes: 259 cells/uL (ref 200–950)
Basophils Absolute: 29 cells/uL (ref 0–200)
Basophils Relative: 0.6 %
Eosinophils Absolute: 48 cells/uL (ref 15–500)
Eosinophils Relative: 1 %
HCT: 39.4 % (ref 35.0–45.0)
Hemoglobin: 13.6 g/dL (ref 11.7–15.5)
Lymphs Abs: 1824 cells/uL (ref 850–3900)
MCH: 33.1 pg — ABNORMAL HIGH (ref 27.0–33.0)
MCHC: 34.5 g/dL (ref 32.0–36.0)
MCV: 95.9 fL (ref 80.0–100.0)
MPV: 9 fL (ref 7.5–12.5)
Monocytes Relative: 5.4 %
Neutro Abs: 2640 cells/uL (ref 1500–7800)
Neutrophils Relative %: 55 %
Platelets: 405 10*3/uL — ABNORMAL HIGH (ref 140–400)
RBC: 4.11 10*6/uL (ref 3.80–5.10)
RDW: 11.7 % (ref 11.0–15.0)
Total Lymphocyte: 38 %
WBC: 4.8 10*3/uL (ref 3.8–10.8)

## 2020-10-29 LAB — COMPREHENSIVE METABOLIC PANEL
AG Ratio: 1.3 (calc) (ref 1.0–2.5)
ALT: 22 U/L (ref 6–29)
AST: 19 U/L (ref 10–35)
Albumin: 3.9 g/dL (ref 3.6–5.1)
Alkaline phosphatase (APISO): 45 U/L (ref 37–153)
BUN: 16 mg/dL (ref 7–25)
CO2: 28 mmol/L (ref 20–32)
Calcium: 9.1 mg/dL (ref 8.6–10.4)
Chloride: 103 mmol/L (ref 98–110)
Creat: 0.63 mg/dL (ref 0.50–1.05)
Globulin: 2.9 g/dL (calc) (ref 1.9–3.7)
Glucose, Bld: 82 mg/dL (ref 65–99)
Potassium: 3.8 mmol/L (ref 3.5–5.3)
Sodium: 140 mmol/L (ref 135–146)
Total Bilirubin: 0.5 mg/dL (ref 0.2–1.2)
Total Protein: 6.8 g/dL (ref 6.1–8.1)

## 2020-10-29 LAB — TSH: TSH: 0.83 mIU/L

## 2020-10-29 LAB — HEPATITIS C ANTIBODY
Hepatitis C Ab: NONREACTIVE
SIGNAL TO CUT-OFF: 0.03 (ref <1.00)

## 2020-10-30 ENCOUNTER — Other Ambulatory Visit: Payer: Self-pay

## 2020-10-30 DIAGNOSIS — E785 Hyperlipidemia, unspecified: Secondary | ICD-10-CM

## 2020-10-30 MED ORDER — ROSUVASTATIN CALCIUM 5 MG PO TABS: 5.0000 mg | ORAL_TABLET | Freq: Every day | ORAL | 1 refills | Status: DC

## 2020-11-08 NOTE — Addendum Note (Signed)
Addended byMarlowe Sax C on: 11/08/2020 12:35 PM   Modules accepted: Level of Service

## 2021-02-03 ENCOUNTER — Other Ambulatory Visit: Payer: Self-pay | Admitting: Family

## 2021-03-07 ENCOUNTER — Other Ambulatory Visit: Payer: Self-pay

## 2021-03-07 ENCOUNTER — Other Ambulatory Visit: Payer: 59

## 2021-03-07 DIAGNOSIS — E785 Hyperlipidemia, unspecified: Secondary | ICD-10-CM

## 2021-03-08 ENCOUNTER — Other Ambulatory Visit: Payer: Self-pay

## 2021-03-08 LAB — COMPLETE METABOLIC PANEL WITH GFR
AG Ratio: 1.3 (calc) (ref 1.0–2.5)
ALT: 12 U/L (ref 6–29)
AST: 16 U/L (ref 10–35)
Albumin: 4 g/dL (ref 3.6–5.1)
Alkaline phosphatase (APISO): 41 U/L (ref 37–153)
BUN: 11 mg/dL (ref 7–25)
CO2: 27 mmol/L (ref 20–32)
Calcium: 8.9 mg/dL (ref 8.6–10.4)
Chloride: 102 mmol/L (ref 98–110)
Creat: 0.66 mg/dL (ref 0.50–1.05)
GFR, Est African American: 118 mL/min/{1.73_m2} (ref >=60)
GFR, Est Non African American: 102 mL/min/{1.73_m2} (ref >=60)
Globulin: 3 g/dL (calc) (ref 1.9–3.7)
Glucose, Bld: 87 mg/dL (ref 65–99)
Potassium: 3.8 mmol/L (ref 3.5–5.3)
Sodium: 138 mmol/L (ref 135–146)
Total Bilirubin: 0.5 mg/dL (ref 0.2–1.2)
Total Protein: 7 g/dL (ref 6.1–8.1)

## 2021-03-08 LAB — LIPID PANEL
Cholesterol: 146 mg/dL (ref <200)
HDL: 53 mg/dL (ref >=50)
LDL Cholesterol (Calc): 79 mg/dL (calc)
Non-HDL Cholesterol (Calc): 93 mg/dL (calc) (ref <130)
Total CHOL/HDL Ratio: 2.8 (calc) (ref <5.0)
Triglycerides: 67 mg/dL (ref <150)

## 2021-03-11 ENCOUNTER — Encounter: Payer: Self-pay | Admitting: Family

## 2021-03-11 ENCOUNTER — Telehealth: Payer: Self-pay

## 2021-03-11 ENCOUNTER — Telehealth (INDEPENDENT_AMBULATORY_CARE_PROVIDER_SITE_OTHER): Payer: 59 | Admitting: Family

## 2021-03-11 ENCOUNTER — Other Ambulatory Visit: Payer: Self-pay

## 2021-03-11 DIAGNOSIS — I10 Essential (primary) hypertension: Secondary | ICD-10-CM

## 2021-03-11 DIAGNOSIS — E785 Hyperlipidemia, unspecified: Secondary | ICD-10-CM

## 2021-03-11 DIAGNOSIS — H1013 Acute atopic conjunctivitis, bilateral: Secondary | ICD-10-CM | POA: Diagnosis not present

## 2021-03-11 MED ORDER — ROSUVASTATIN CALCIUM 5 MG PO TABS: 5.0000 mg | ORAL_TABLET | Freq: Every day | ORAL | 1 refills | Status: DC

## 2021-03-11 MED ORDER — HYDROCHLOROTHIAZIDE 25 MG PO TABS: 1.0000 | ORAL_TABLET | Freq: Every day | ORAL | 1 refills | Status: DC

## 2021-03-11 NOTE — Progress Notes (Signed)
This service is provided via telemedicine  No vital signs collected/recorded due to the encounter was a telemedicine visit.   Location of patient (ex: home, work): Home.  Patient consents to a telephone visit: Yes  Location of the provider (ex: office, home):  [facility].  Name of any referring provider: Haeven Ward, Penny Bucks, NP   Names of all persons participating in the telemedicine service and their role in the encounter: Patient, Penny Ward, [place], [place], Webb Silversmith, NP.    Time spent on call: 8 minutes spent on the phone with Medical Assistant.    Provider: Marlowe Sax FNP-C  Penny Ward, Penny Bucks, NP  Patient Care Team: Penny Ward, Penny Bucks, NP as PCP - General (Family Medicine) [name], MD as Consulting Physician (Orthopedic Surgery) Molli Posey, MD as Consulting Physician (Obstetrics and Gynecology)  Extended Emergency Contact Information Primary Emergency Contact: [place] Mobile Phone: [phone] Relation: Sister  Code Status:  Full Code  Goals of care: Advanced Directive information Advanced Directives 03/11/2021  Does Patient Have a Medical Advance Directive? No  Does patient want to make changes to medical advance directive? -  Would patient like information on creating a medical advance directive? No - Patient declined     Chief Complaint  Patient presents with  . Medical Management of Chronic Issues    4 month follow up  . Immunizations    Discuss the need for Tetanus Vaccine.    HPI:  Pt is a 55 y.o. female seen today for an acute visit for 4 month follow up visit. Has cut down on sodas,sweets drinks and making better food choice. Will be starting walking exercises when the weather is good. Her recent Cholesterol was 146,TRG 67,LDL 79 previous chol 220,TRG 54,LDL 140.she was started on crestor 5 mg tablet and dietary and exercise modification advised. No side effects reported. Due for Tetanus vaccine.Aware states will go to the  pharmacy soon to get vaccine.script previously send to her pharmacy.  No recent weight check for evaluation thinks possible weight loss since changing her diet and cutting down on sweets/sodas.    Past Medical History:  Diagnosis Date  . Anemia   . Hypertension    Past Surgical History:  Procedure Laterality Date  . ABDOMINAL HYSTERECTOMY Bilateral 11/29/2015   Procedure: HYSTERECTOMY ABDOMINAL;  Surgeon: Molli Posey, MD;  Location: [facility];  Service: Gynecology;  Laterality: Bilateral;  . BILATERAL SALPINGECTOMY Bilateral 11/29/2015   Procedure: BILATERAL SALPINGECTOMY;  Surgeon: Molli Posey, MD;  Location: [facility];  Service: Gynecology;  Laterality: Bilateral;  . BREAST SURGERY  2007   reduction, Cristine Polio, MD  . VAGINAL DELIVERY    . WISDOM TOOTH EXTRACTION Bilateral age - early 38s   all 73 removed    No Known Allergies  Outpatient Encounter Medications as of 03/11/2021  Medication Sig  . hydrochlorothiazide (HYDRODIURIL) 25 MG tablet TAKE 1 TABLET BY MOUTH EVERY DAY  . ibuprofen (ADVIL,MOTRIN) 200 MG tablet Take 200 mg by mouth every 6 (six) hours as needed.  . loratadine (CLARITIN) 10 MG tablet Take 10 mg by mouth as needed for allergies.  . Multiple Vitamin (MULTIVITAMIN) tablet Take 1 tablet by mouth daily.  . rosuvastatin (CRESTOR) 5 MG tablet Take 1 tablet (5 mg total) by mouth daily.  . valACYclovir (VALTREX) 500 MG tablet Take 500 mg by mouth daily.  Marland Kitchen zinc sulfate 220 (50 Zn) MG capsule Take 1 capsule (220 mg total) by mouth daily.  . [DISCONTINUED] loratadine (CLARITIN) 10 MG tablet Take 1  tablet (10 mg total) by mouth daily for 14 days.   No facility-administered encounter medications on file as of 03/11/2021.    Review of Systems  Constitutional: Negative for appetite change, chills, fatigue, fever and unexpected weight change.  HENT: Negative for congestion, dental problem, ear discharge, ear pain, facial swelling, hearing loss, nosebleeds, postnasal  drip, rhinorrhea, sinus pressure, sinus pain, sneezing, sore throat, tinnitus and trouble swallowing.   Eyes: Negative for pain, discharge, redness, itching and visual disturbance.  Respiratory: Negative for cough, chest tightness, shortness of breath and wheezing.   Cardiovascular: Negative for chest pain, palpitations and leg swelling.  Gastrointestinal: Negative for abdominal distention, abdominal pain, blood in stool, constipation, diarrhea, nausea and vomiting.  Endocrine: Negative for cold intolerance, heat intolerance, polydipsia, polyphagia and polyuria.  Genitourinary: Negative for difficulty urinating, dysuria, flank pain, frequency and urgency.  Musculoskeletal: Negative for arthralgias, back pain, gait problem, joint swelling, myalgias, neck pain and neck stiffness.  Skin: Negative for color change, pallor, rash and wound.  Neurological: Negative for dizziness, syncope, speech difficulty, weakness, light-headedness, numbness and headaches.  Hematological: Does not bruise/bleed easily.  Psychiatric/Behavioral: Negative for agitation, behavioral problems, confusion, hallucinations, self-injury, sleep disturbance and suicidal ideas. The patient is not nervous/anxious.     Immunization History  Administered Date(s) Administered  . Influenza Inj Mdck Quad Pf 09/19/2017  . Influenza, Quadrivalent, Recombinant, Inj, Pf 09/24/2017  . Influenza,inj,Quad PF,6+ Mos 11/30/2015, 01/14/2019, 07/20/2019  . Influenza-Unspecified 09/07/2014, 10/09/2020  . PFIZER(Purple Top)SARS-COV-2 Vaccination 05/23/2020, 06/23/2020, 10/09/2020   Pertinent  Health Maintenance Due  Topic Date Due  . MAMMOGRAM  06/13/2021  . INFLUENZA VACCINE  06/24/2021  . COLONOSCOPY (Pts 45-79yr Insurance coverage will need to be confirmed)  11/08/2022  . PAP SMEAR-Modifier  Discontinued   Fall Risk  03/11/2021 10/24/2020 10/22/2020 09/27/2020 01/23/2020  Falls in the past year? 0 0 0 0 0  Number falls in past yr: 0 0 0 - 0   Injury with Fall? 0 0 0 - 0   Functional Status Survey:   There were no vitals filed for this visit. There is no height or weight on file to calculate BMI.   Physical Exam Constitutional:      General: She is not in acute distress.    Appearance: She is not ill-appearing.  Pulmonary:     Effort: Pulmonary effort is normal. No respiratory distress.  Musculoskeletal:        General: Normal range of motion.     Right lower leg: No edema.     Left lower leg: No edema.  Neurological:     Mental Status: She is alert and oriented to person, place, and time.  Psychiatric:        Mood and Affect: Mood normal.        Behavior: Behavior normal.        Thought Content: Thought content normal.        Judgment: Judgment normal.     Labs reviewed: Recent Labs    10/26/20 1022 03/07/21 1529  NA 140 138  K 3.8 3.8  CL 103 102  CO2 28 27  GLUCOSE 82 87  BUN 16 11  CREATININE 0.63 0.66  CALCIUM 9.1 8.9   Recent Labs    10/26/20 1022 03/07/21 1529  AST 19 16  ALT 22 12  BILITOT 0.5 0.5  PROT 6.8 7.0   Recent Labs    10/26/20 1022  WBC 4.8  NEUTROABS 2,640  HGB 13.6  HCT 39.4  MCV 95.9  PLT 405*   Lab Results  Component Value Date   TSH 0.83 10/26/2020   No results found for: HGBA1C Lab Results  Component Value Date   CHOL 146 03/07/2021   HDL 53 03/07/2021   LDLCALC 79 03/07/2021   TRIG 67 03/07/2021   CHOLHDL 2.8 03/07/2021    Significant Diagnostic Results in last 30 days:  No results found.  Assessment/Plan  1. Hyperlipidemia LDL goal <100 Latest LDL at goal. - continue with Dietary modification and exercise at least 3 times per week for 30 minutes. - additional DASH Eating plan Education information provided on AVS  - continue on Crestor  - rosuvastatin (CRESTOR) 5 MG tablet; Take 1 tablet (5 mg total) by mouth daily.  Dispense: 90 tablet; Refill: 1 - Lipid panel; Future  2. Essential hypertension No home B/p for review. Asymptomatic  -  continue on HCZT  - continue on Statin - hydrochlorothiazide (HYDRODIURIL) 25 MG tablet; Take 1 tablet (25 mg total) by mouth daily.  Dispense: 90 tablet; Refill: 1 - CBC with Differential/Platelet; Future - CMP with eGFR([facility]); Future - TSH; Future  3. Allergic conjunctivitis of both eyes - continue on loratadine   Family/ staff Communication: Reviewed plan of care with patient verbalized understanding.  Labs/tests ordered:   - CBC with Differential/Platelet; Future - CMP with eGFR([facility]); Future - TSH; Future  Next Appointment: 6 months for medical management of chronic issues.Fasting labs prior to visit   I connected with  Penny Ward on 03/11/21 by a Telephone enabled telemedicine application and verified that I am speaking with the correct person using two identifiers.   I discussed the limitations of evaluation and management by telemedicine. The patient expressed understanding and agreed to proceed.  Scheduled for [place] visit but was unable to unmute her video switched to Telephone at 5 pm   Spent 20 minutes of non-face to face with patient      Sandrea Hughs, NP

## 2021-03-11 NOTE — Patient Instructions (Signed)
-   continue with current medication

## 2021-03-11 NOTE — Telephone Encounter (Signed)
Ms. tacie, mccuistion are scheduled for a virtual visit with your provider today.    Just as we do with appointments in the office, we must obtain your consent to participate.  Your consent will be active for this visit and any virtual visit you may have with one of our providers in the next 365 days.    If you have a MyChart account, I can also send a copy of this consent to you electronically.  All virtual visits are billed to your insurance company just like a traditional visit in the office.  As this is a virtual visit, video technology does not allow for your provider to perform a traditional examination.  This may limit your provider's ability to fully assess your condition.  If your provider identifies any concerns that need to be evaluated in person or the need to arrange testing such as labs, EKG, etc, we will make arrangements to do so.    Although advances in technology are sophisticated, we cannot ensure that it will always work on either your end or our end.  If the connection with a video visit is poor, we may have to switch to a telephone visit.  With either a video or telephone visit, we are not always able to ensure that we have a secure connection.   I need to obtain your verbal consent now.   Are you willing to proceed with your visit today?      [place], [place] 03/11/2021  4:57 PM

## 2021-04-29 ENCOUNTER — Other Ambulatory Visit: Payer: Self-pay | Admitting: Family

## 2021-04-29 DIAGNOSIS — E785 Hyperlipidemia, unspecified: Secondary | ICD-10-CM

## 2021-06-17 DIAGNOSIS — E785 Hyperlipidemia, unspecified: Secondary | ICD-10-CM | POA: Insufficient documentation

## 2021-09-05 ENCOUNTER — Encounter: Payer: Self-pay | Admitting: Orthopedic Surgery

## 2021-09-05 ENCOUNTER — Ambulatory Visit (INDEPENDENT_AMBULATORY_CARE_PROVIDER_SITE_OTHER): Payer: 59 | Admitting: Orthopedic Surgery

## 2021-09-05 ENCOUNTER — Other Ambulatory Visit: Payer: Self-pay

## 2021-09-05 VITALS — BP 134/82 | HR 73 | Temp 97.3°F | Ht 67.0 in | Wt 194.4 lb

## 2021-09-05 DIAGNOSIS — L03115 Cellulitis of right lower limb: Secondary | ICD-10-CM | POA: Diagnosis not present

## 2021-09-05 MED ORDER — DOXYCYCLINE HYCLATE 100 MG PO TABS
100.0000 mg | ORAL_TABLET | Freq: Two times a day (BID) | ORAL | 0 refills | Status: DC
Start: 2021-09-05 — End: 2021-11-13

## 2021-09-05 NOTE — Patient Instructions (Signed)
May use ice on ankle to help with itching. May also use zyrtec- 1 tablet daily to help with itching.   Complete all antibiotics.

## 2021-09-05 NOTE — Progress Notes (Signed)
Careteam: Patient Care Team: Penny Ward, Penny Bucks, NP as PCP - General (Family Medicine) [name], MD as Consulting Physician (Orthopedic Surgery) Molli Posey, MD as Consulting Physician (Obstetrics and Gynecology)  Seen by: Windell Moulding, AGNP-C  PLACE OF SERVICE:  [place] Directive information    No Known Allergies  Chief Complaint  Patient presents with   Acute Visit    Patient presents today for ankle pain and swollen.    Quality Metric Gaps    Mammogram, Flu, COVID booster, zoster, TDAP     HPI: Patient is a 55 y.o. female seen today for ankle pain and swelling.   This weekend she was outside and developed a insect bite to her right ankle. A few days later she noticed her right ankle was red, swollen, and warm where bite occurred. Remains febrile. Denies pain. Also having some itching near bite site. She tried applying some hydrocortisone cream without success.   Review of Systems:  Review of Systems  Constitutional:  Negative for chills, fever, malaise/fatigue and weight loss.  Respiratory:  Negative for cough, shortness of breath and wheezing.   Cardiovascular:  Negative for chest pain and leg swelling.  Skin:        Insect bite to right ankle  Psychiatric/Behavioral:  Negative for depression. The patient is not nervous/anxious.    Past Medical History:  Diagnosis Date   Anemia    Hypertension    Past Surgical History:  Procedure Laterality Date   ABDOMINAL HYSTERECTOMY Bilateral 11/29/2015   Procedure: HYSTERECTOMY ABDOMINAL;  Surgeon: Molli Posey, MD;  Location: [facility];  Service: Gynecology;  Laterality: Bilateral;   BILATERAL SALPINGECTOMY Bilateral 11/29/2015   Procedure: BILATERAL SALPINGECTOMY;  Surgeon: Molli Posey, MD;  Location: [facility];  Service: Gynecology;  Laterality: Bilateral;   BREAST SURGERY  2007   reduction, Cristine Polio, MD   VAGINAL DELIVERY     WISDOM TOOTH EXTRACTION Bilateral age - early 42s   all 3  removed   Social History:   reports that she has never smoked. She has never used smokeless tobacco. She reports that she does not drink alcohol and does not use drugs.  Family History  Problem Relation Age of Onset   Dementia Mother    Hypertension Mother    Hyperlipidemia Mother    Arthritis Father    Fibromyalgia Sister    Migraines Sister    Hypertension Sister    Arthritis Sister    Heart murmur Sister    Bipolar disorder Sister    Heart attack Maternal Grandfather    Stroke Maternal Grandmother    Cancer Maternal Grandmother    Arthritis Maternal Grandmother    Cancer Paternal Grandmother    Colon cancer Neg Hx    Esophageal cancer Neg Hx    Rectal cancer Neg Hx    Stomach cancer Neg Hx     Medications: Patient's Medications  New Prescriptions   No medications on file  Previous Medications   HYDROCHLOROTHIAZIDE (HYDRODIURIL) 25 MG TABLET    Take 1 tablet (25 mg total) by mouth daily.   IBUPROFEN (ADVIL,MOTRIN) 200 MG TABLET    Take 200 mg by mouth every 6 (six) hours as needed.   LORATADINE (CLARITIN) 10 MG TABLET    Take 10 mg by mouth as needed for allergies.   MULTIPLE VITAMIN (MULTIVITAMIN) TABLET    Take 1 tablet by mouth daily.   ROSUVASTATIN (CRESTOR) 5 MG TABLET    TAKE 1 TABLET(5 MG) BY  MOUTH DAILY   VALACYCLOVIR (VALTREX) 500 MG TABLET    Take 500 mg by mouth daily.   ZINC SULFATE 220 (50 ZN) MG CAPSULE    Take 1 capsule (220 mg total) by mouth daily.  Modified Medications   No medications on file  Discontinued Medications   No medications on file    Physical Exam:  Vitals:   09/05/21 1445  BP: 134/82  Pulse: 73  Temp: (!) 97.3 F (36.3 C)  SpO2: 98%  Weight: 194 lb 6.4 oz (88.2 kg)  Height: 5\' 7"  (1.702 m)   Body mass index is 30.45 kg/m. Wt Readings from Last 3 Encounters:  09/05/21 194 lb 6.4 oz (88.2 kg)  10/24/20 189 lb 3.2 oz (85.8 kg)  10/22/20 189 lb 6.4 oz (85.9 kg)    Physical Exam Vitals reviewed.  Constitutional:       General: She is not in acute distress.    Appearance: She is not ill-appearing.  Cardiovascular:     Rate and Rhythm: Normal rate and regular rhythm.     Pulses: Normal pulses.     Heart sounds: Normal heart sounds. No murmur heard. Pulmonary:     Effort: Pulmonary effort is normal. No respiratory distress.     Breath sounds: Normal breath sounds. No wheezing.  Musculoskeletal:     Right lower leg: Edema present.     Left lower leg: No edema.     Comments: Right ankle with non-pitting edema.   Skin:    General: Skin is warm and dry.     Capillary Refill: Capillary refill takes less than 2 seconds.     Comments: Right ankle swollen, two small bite marks on lateral/dorsal side, serosanguinous drainage present, ankle warm to touch and tender when touched.   Neurological:     General: No focal deficit present.     Mental Status: She is alert and oriented to person, place, and time.  Psychiatric:        Mood and Affect: Mood normal.        Behavior: Behavior normal.    Labs reviewed: Basic Metabolic Panel: Recent Labs    10/26/20 1022 03/07/21 1529  NA 140 138  K 3.8 3.8  CL 103 102  CO2 28 27  GLUCOSE 82 87  BUN 16 11  CREATININE 0.63 0.66  CALCIUM 9.1 8.9  TSH 0.83  --    Liver Function Tests: Recent Labs    10/26/20 1022 03/07/21 1529  AST 19 16  ALT 22 12  BILITOT 0.5 0.5  PROT 6.8 7.0   No results for input(s): LIPASE, AMYLASE in the last 8760 hours. No results for input(s): AMMONIA in the last 8760 hours. CBC: Recent Labs    10/26/20 1022  WBC 4.8  NEUTROABS 2,640  HGB 13.6  HCT 39.4  MCV 95.9  PLT 405*   Lipid Panel: Recent Labs    10/26/20 1022 03/07/21 1529  CHOL 220* 146  HDL 66 53  LDLCALC 140* 79  TRIG 45 67  CHOLHDL 3.3 2.8   TSH: Recent Labs    10/26/20 1022  TSH 0.83   A1C: No results found for: HGBA1C   Assessment/Plan 1. Cellulitis of right lower extremity - due to insect bite one week ago - increased redness,  swelling and pain to right ankle began a few days ago - remains afebrile - advised to contact PCP if symptoms worsen - doxycycline (VIBRA-TABS) 100 MG tablet; Take 1 tablet (100 mg total) by  mouth 2 (two) times daily.  Dispense: 14 tablet; Refill: 0 - recommend zyrtec 10 mg daily prn for itching   Total time: 15 minutes. Greater than 50% of total time spent doing patient education on symptom and medication management.    Next appt: none Stacy Deshler [place], [facility] [phone]

## 2021-09-09 ENCOUNTER — Other Ambulatory Visit: Payer: Self-pay | Admitting: Family

## 2021-09-09 DIAGNOSIS — I10 Essential (primary) hypertension: Secondary | ICD-10-CM

## 2021-11-13 ENCOUNTER — Ambulatory Visit (INDEPENDENT_AMBULATORY_CARE_PROVIDER_SITE_OTHER): Payer: 59 | Admitting: Adult Health

## 2021-11-13 ENCOUNTER — Other Ambulatory Visit: Payer: Self-pay

## 2021-11-13 ENCOUNTER — Encounter: Payer: Self-pay | Admitting: Adult Health

## 2021-11-13 ENCOUNTER — Other Ambulatory Visit: Payer: Self-pay | Admitting: Family

## 2021-11-13 VITALS — BP 136/84 | HR 101 | Temp 99.8°F

## 2021-11-13 DIAGNOSIS — E785 Hyperlipidemia, unspecified: Secondary | ICD-10-CM

## 2021-11-13 DIAGNOSIS — J209 Acute bronchitis, unspecified: Secondary | ICD-10-CM

## 2021-11-13 LAB — POCT INFLUENZA A/B
Influenza A, POC: NEGATIVE
Influenza B, POC: NEGATIVE

## 2021-11-13 MED ORDER — BENZONATATE 100 MG PO CAPS: 100.0000 mg | ORAL_CAPSULE | Freq: Three times a day (TID) | ORAL | 0 refills | Status: DC | PRN

## 2021-11-13 MED ORDER — AZITHROMYCIN 250 MG PO TABS: ORAL_TABLET | ORAL | 0 refills | Status: AC

## 2021-11-13 NOTE — Patient Instructions (Signed)
Take mucinex DM twice daily while coughing Tesselon 100 mg every 8 hours as needed Z pack take as directed   Call for temp higher than 101.5

## 2021-11-13 NOTE — Progress Notes (Signed)
Location:  [facility] of Service:   clinic    CODE STATUS: full   No Known Allergies  Chief Complaint  Patient presents with   Acute Visit    Complains of Chest congestion and Deep cough. Covid test Negative.     HPI:  This past weekend has developed a cough with low grade fever. She has been taking mucinex DM twice daily with little relief has not taken tylenol for her fevers. She has some fatigue with some shortness of breath. No wheezing present. her appetite is poor at this time.   Past Medical History:  Diagnosis Date   Anemia    Hypertension     Past Surgical History:  Procedure Laterality Date   ABDOMINAL HYSTERECTOMY Bilateral 11/29/2015   Procedure: HYSTERECTOMY ABDOMINAL;  Surgeon: Molli Posey, MD;  Location: [facility];  Service: Gynecology;  Laterality: Bilateral;   BILATERAL SALPINGECTOMY Bilateral 11/29/2015   Procedure: BILATERAL SALPINGECTOMY;  Surgeon: Molli Posey, MD;  Location: [facility];  Service: Gynecology;  Laterality: Bilateral;   BREAST SURGERY  2007   reduction, Cristine Polio, MD   VAGINAL DELIVERY     WISDOM TOOTH EXTRACTION Bilateral age - early 46s   all 60 removed    Social History   Socioeconomic History   Marital status: Divorced    Spouse name: Not on file   Number of children: 1   Years of education: Not on file   Highest education level: Not on file  Occupational History   Not on file  Tobacco Use   Smoking status: Never   Smokeless tobacco: Never  Vaping Use   Vaping Use: Never used  Substance and Sexual Activity   Alcohol use: No   Drug use: No   Sexual activity: Yes  Other Topics Concern   Not on file  Social History Narrative   Diet- None   Caffeine- Yes (coffee, soda and tea)   Married- Yes [place]- 2 stories, 3 people   Pets- No   Current/Past profession- Marketing executive   Exercise- No   Living Will- No   DNR- No   POA/HPOA- No   Social Determinants of Systems developer Strain: Not on file  Food Insecurity: Not on file  Transportation Needs: Not on file  Physical Activity: Not on file  Stress: Not on file  Social Connections: Not on file  Intimate Partner Violence: Not on file   Family History  Problem Relation Age of Onset   Dementia Mother    Hypertension Mother    Hyperlipidemia Mother    Arthritis Father    Fibromyalgia Sister    Migraines Sister    Hypertension Sister    Arthritis Sister    Heart murmur Sister    Bipolar disorder Sister    Heart attack Maternal Grandfather    Stroke Maternal Grandmother    Cancer Maternal Grandmother    Arthritis Maternal Grandmother    Cancer Paternal Grandmother    Colon cancer Neg Hx    Esophageal cancer Neg Hx    Rectal cancer Neg Hx    Stomach cancer Neg Hx       VITAL SIGNS BP 136/84    Pulse (!) 101    Temp 99.8 F (37.7 C) (Skin)    LMP 11/24/2015 (Exact Date)    SpO2 97%   Outpatient Encounter Medications as of 11/13/2021  Medication Sig   hydrochlorothiazide (HYDRODIURIL) 25 MG tablet TAKE 1  TABLET(25 MG) BY MOUTH DAILY   ibuprofen (ADVIL,MOTRIN) 200 MG tablet Take 200 mg by mouth every 6 (six) hours as needed.   loratadine (CLARITIN) 10 MG tablet Take 10 mg by mouth as needed for allergies.   Multiple Vitamin (MULTIVITAMIN) tablet Take 1 tablet by mouth daily.   rosuvastatin (CRESTOR) 5 MG tablet TAKE 1 TABLET(5 MG) BY MOUTH DAILY   valACYclovir (VALTREX) 500 MG tablet Take 500 mg by mouth daily.   [DISCONTINUED] doxycycline (VIBRA-TABS) 100 MG tablet Take 1 tablet (100 mg total) by mouth 2 (two) times daily.   [DISCONTINUED] zinc sulfate 220 (50 Zn) MG capsule Take 1 capsule (220 mg total) by mouth daily.   No facility-administered encounter medications on file as of 11/13/2021.     SIGNIFICANT DIAGNOSTIC EXAMS  Review of Systems  Constitutional:  Positive for fever and malaise/fatigue.  HENT:  Positive for congestion.   Respiratory:  Positive for cough, sputum  production and shortness of breath.   Cardiovascular:  Negative for chest pain, palpitations and leg swelling.  Gastrointestinal:  Negative for abdominal pain, constipation and heartburn.  Musculoskeletal:  Negative for back pain, joint pain and myalgias.  Skin: Negative.   Neurological:  Negative for dizziness.  Psychiatric/Behavioral:  The patient is not nervous/anxious.     Physical Exam Constitutional:      General: She is not in acute distress.    Appearance: She is well-developed and overweight. She is not diaphoretic.  Neck:     Thyroid: No thyromegaly.  Cardiovascular:     Rate and Rhythm: Normal rate and regular rhythm.     Pulses: Normal pulses.     Heart sounds: Normal heart sounds.  Pulmonary:     Effort: Pulmonary effort is normal. No respiratory distress.     Breath sounds: Rhonchi present.  Abdominal:     General: Bowel sounds are normal. There is no distension.     Palpations: Abdomen is soft.     Tenderness: There is no abdominal tenderness.  Musculoskeletal:        General: Normal range of motion.     Cervical back: Neck supple.     Right lower leg: No edema.     Left lower leg: No edema.  Lymphadenopathy:     Cervical: Cervical adenopathy present.  Skin:    General: Skin is warm and dry.  Neurological:     Mental Status: She is alert and oriented to person, place, and time.  Psychiatric:        Mood and Affect: Mood normal.      ASSESSMENT/ PLAN:  TODAY  Acute bronchitis:  will begin tesselon perle every 8 hours as needed for cough  Will take a zpack and take as directed Will continue to use mucinex DM twice daily  Will take tylenol as needed for fever Will call for fever >101.5  Will call if not feeling better in 5 days,      Ok Edwards NP [facility]   [phone]

## 2022-05-23 ENCOUNTER — Other Ambulatory Visit: Payer: Self-pay | Admitting: Family

## 2022-05-23 DIAGNOSIS — E785 Hyperlipidemia, unspecified: Secondary | ICD-10-CM

## 2022-06-23 LAB — HM MAMMOGRAPHY

## 2022-06-24 ENCOUNTER — Encounter: Payer: Self-pay | Admitting: Family

## 2022-06-24 ENCOUNTER — Ambulatory Visit (INDEPENDENT_AMBULATORY_CARE_PROVIDER_SITE_OTHER): Payer: 59 | Admitting: Family

## 2022-06-24 VITALS — BP 120/82 | HR 86 | Temp 97.7°F | Resp 16 | Ht 67.0 in | Wt 202.2 lb

## 2022-06-24 DIAGNOSIS — Z0000 Encounter for general adult medical examination without abnormal findings: Secondary | ICD-10-CM | POA: Diagnosis not present

## 2022-06-24 DIAGNOSIS — N951 Menopausal and female climacteric states: Secondary | ICD-10-CM

## 2022-06-24 DIAGNOSIS — Z23 Encounter for immunization: Secondary | ICD-10-CM

## 2022-06-24 DIAGNOSIS — I10 Essential (primary) hypertension: Secondary | ICD-10-CM

## 2022-06-24 DIAGNOSIS — E785 Hyperlipidemia, unspecified: Secondary | ICD-10-CM

## 2022-06-24 MED ORDER — TETANUS-DIPHTH-ACELL PERTUSSIS 5-2.5-18.5 LF-MCG/0.5 IM SUSP: 0.5000 mL | Freq: Once | INTRAMUSCULAR | 0 refills | Status: AC

## 2022-06-24 NOTE — Progress Notes (Signed)
Provider: Marlowe Sax FNP-C   Avianah Pellman, Nelda Bucks, NP  Patient Care Team: Conal Shetley, Nelda Bucks, NP as PCP - General (Family Medicine) [name], MD as Consulting Physician (Orthopedic Surgery) Molli Posey, MD as Consulting Physician (Obstetrics and Gynecology)  Extended Emergency Contact Information Primary Emergency Contact: [place] Mobile Phone: [phone] Relation: Sister  Code Status:  Full Code  Goals of care: Advanced Directive information    06/24/2022    3:38 PM  Advanced Directives  Does Patient Have a Medical Advance Directive? No  Would patient like information on creating a medical advance directive? No - Patient declined     Chief Complaint  Patient presents with   Annual Exam    PHYSICAL   Immunizations    Discuss the need for Tetanus vaccine, and Covid Booster.     HPI:  Pt is a 56 y.o. female seen today for Annual physical examination and medical management of chronic diseases.    She complains of hot flushes and tiredness.states was seen earlier today by Gynecologist at [facility] was given samples of estrogen cream then notify GYN if effective will be given a prescription. Also had Mammogram done yesterday by [facility].   Has had a weight gain 8 lbs since last visit.states has not been doing her walking exercises as she used to.   Leg swelling worst with eating salt foods.No swelling today.wears compression stockings to keep swelling down.Also elevates her legs when seated.   Tetanus - script send to pharmacy in the past but has not gotten vaccine.Aware to get vaccine at the pharmacy.   COVID-19 - due for 5 th COVID-19 booster vaccine.   Past Medical History:  Diagnosis Date   Anemia    Hypertension    Past Surgical History:  Procedure Laterality Date   ABDOMINAL HYSTERECTOMY Bilateral 11/29/2015   Procedure: HYSTERECTOMY ABDOMINAL;  Surgeon: Molli Posey, MD;  Location: [facility];  Service: Gynecology;   Laterality: Bilateral;   BILATERAL SALPINGECTOMY Bilateral 11/29/2015   Procedure: BILATERAL SALPINGECTOMY;  Surgeon: Molli Posey, MD;  Location: [facility];  Service: Gynecology;  Laterality: Bilateral;   BREAST SURGERY  2007   reduction, Cristine Polio, MD   VAGINAL DELIVERY     WISDOM TOOTH EXTRACTION Bilateral age - early 25s   all 89 removed    No Known Allergies  Allergies as of 06/24/2022   No Known Allergies      Medication List        Accurate as of June 24, 2022  3:45 PM. If you have any questions, ask your nurse or doctor.          STOP taking these medications    benzonatate 100 MG capsule Commonly known as: TESSALON Stopped by: Sandrea Hughs, NP       TAKE these medications    hydrochlorothiazide 25 MG tablet Commonly known as: HYDRODIURIL TAKE 1 TABLET(25 MG) BY MOUTH DAILY   ibuprofen 200 MG tablet Commonly known as: ADVIL Take 200 mg by mouth every 6 (six) hours as needed.   loratadine 10 MG tablet Commonly known as: CLARITIN Take 10 mg by mouth as needed for allergies.   multivitamin tablet Take 1 tablet by mouth daily.   rosuvastatin 5 MG tablet Commonly known as: CRESTOR TAKE 1 TABLET(5 MG) BY MOUTH DAILY   valACYclovir 500 MG tablet Commonly known as: VALTREX Take 500 mg by mouth daily.        Review of Systems  Constitutional:  Positive for  fatigue. Negative for appetite change, chills, fever and unexpected weight change.  HENT:  Negative for congestion, dental problem, ear discharge, ear pain, facial swelling, hearing loss, nosebleeds, postnasal drip, rhinorrhea, sinus pressure, sinus pain, sneezing, sore throat, tinnitus and trouble swallowing.   Eyes:  Negative for pain, discharge, redness, itching and visual disturbance.  Respiratory:  Negative for cough, chest tightness, shortness of breath and wheezing.   Cardiovascular:  Negative for chest pain, palpitations and leg swelling.  Gastrointestinal:  Negative for abdominal  distention, abdominal pain, blood in stool, constipation, diarrhea, nausea and vomiting.  Endocrine: Negative for cold intolerance, heat intolerance, polydipsia, polyphagia and polyuria.       Hot flushes   Genitourinary:  Negative for difficulty urinating, dysuria, flank pain, frequency and urgency.  Musculoskeletal:  Negative for arthralgias, back pain, gait problem, joint swelling, myalgias, neck pain and neck stiffness.  Skin:  Negative for color change, pallor, rash and wound.  Neurological:  Negative for dizziness, syncope, speech difficulty, weakness, light-headedness, numbness and headaches.  Hematological:  Does not bruise/bleed easily.  Psychiatric/Behavioral:  Negative for agitation, behavioral problems, confusion, hallucinations, self-injury, sleep disturbance and suicidal ideas. The patient is not nervous/anxious.     Immunization History  Administered Date(s) Administered   Influenza Inj Mdck Quad Pf 09/19/2017   Influenza, Quadrivalent, Recombinant, Inj, Pf 09/24/2017   Influenza,inj,Quad PF,6+ Mos 11/30/2015, 01/14/2019, 07/20/2019   Influenza-Unspecified 09/07/2014, 10/09/2020   PFIZER(Purple Top)SARS-COV-2 Vaccination 05/23/2020, 06/23/2020, 10/09/2020, 08/03/2021   Zoster Recombinat (Shingrix) 10/23/2021, 01/21/2022   Pertinent  Health Maintenance Due  Topic Date Due   MAMMOGRAM  06/13/2021   INFLUENZA VACCINE  06/24/2022   COLONOSCOPY (Pts 45-8yr Insurance coverage will need to be confirmed)  11/08/2022   PAP SMEAR-Modifier  Discontinued      10/22/2020    3:55 PM 10/24/2020    3:36 PM 03/11/2021    4:54 PM 09/05/2021    2:48 PM 06/24/2022    3:38 PM  Fall Risk  Falls in the past year? 0 0 0 0 0  Was there an injury with Fall? 0 0 0 0 0  Fall Risk Category Calculator 0 0 0 0 0  Fall Risk Category _0   Patient Fall Risk Level _1   Patient at Risk for Falls Due to    No Fall Risks  No Fall Risks  Fall risk Follow up    Falls evaluation completed;Education provided;Falls prevention discussed Falls evaluation completed   Functional Status Survey:    Vitals:   06/24/22 1533  BP: 120/82  Pulse: 86  Resp: 16  Temp: 97.7 F (36.5 C)  SpO2: 98%  Weight: 202 lb 3.2 oz (91.7 kg)  Height: _2  (1.702 m)   Body mass index is 31.67 kg/m. Physical Exam Vitals reviewed.  Constitutional:      General: She is not in acute distress.    Appearance: Normal appearance. She is normal weight. She is not ill-appearing or diaphoretic.  HENT:     Head: Normocephalic.     Right Ear: Tympanic membrane, ear canal and external ear normal. There is no impacted cerumen.     Left Ear: Tympanic membrane, ear canal and external ear normal. There is no impacted cerumen.     Nose: Nose normal. No congestion or rhinorrhea.     Mouth/Throat:     Mouth: Mucous membranes are moist.     Pharynx: Oropharynx is  clear. No oropharyngeal exudate or posterior oropharyngeal erythema.  Eyes:     General: No scleral icterus.       Right eye: No discharge.        Left eye: No discharge.     Extraocular Movements: Extraocular movements intact.     Conjunctiva/sclera: Conjunctivae normal.     Pupils: Pupils are equal, round, and reactive to light.  Neck:     Vascular: No carotid bruit.  Cardiovascular:     Rate and Rhythm: Normal rate and regular rhythm.     Pulses: Normal pulses.     Heart sounds: Normal heart sounds. No murmur heard.    No friction rub. No gallop.  Pulmonary:     Effort: Pulmonary effort is normal. No respiratory distress.     Breath sounds: Normal breath sounds. No wheezing, rhonchi or rales.  Chest:     Chest wall: No tenderness.  Abdominal:     General: Bowel sounds are normal. There is no distension.     Palpations: Abdomen is soft. There is no mass.     Tenderness: There is no abdominal tenderness. There is no right CVA tenderness, left CVA tenderness, guarding or  rebound.  Musculoskeletal:        General: No swelling or tenderness. Normal range of motion.     Cervical back: Normal range of motion. No rigidity or tenderness.     Right lower leg: No edema.     Left lower leg: No edema.  Lymphadenopathy:     Cervical: No cervical adenopathy.  Skin:    General: Skin is warm and dry.     Coloration: Skin is not pale.     Findings: No bruising, erythema, lesion or rash.  Neurological:     Mental Status: She is alert and oriented to person, place, and time.     Cranial Nerves: No cranial nerve deficit.     Sensory: No sensory deficit.     Motor: No weakness.     Coordination: Coordination normal.     Gait: Gait normal.  Psychiatric:        Mood and Affect: Mood normal.        Speech: Speech normal.        Behavior: Behavior normal.        Thought Content: Thought content normal.        Judgment: Judgment normal.     Labs reviewed: No results for input(s): "NA", "K", "CL", "CO2", "GLUCOSE", "BUN", "CREATININE", "CALCIUM", "MG", "PHOS" in the last 8760 hours. No results for input(s): "AST", "ALT", "ALKPHOS", "BILITOT", "PROT", "ALBUMIN" in the last 8760 hours. No results for input(s): "WBC", "NEUTROABS", "HGB", "HCT", "MCV", "PLT" in the last 8760 hours. Lab Results  Component Value Date   TSH 0.83 10/26/2020   No results found for: "HGBA1C" Lab Results  Component Value Date   CHOL 146 03/07/2021   HDL 53 03/07/2021   LDLCALC 79 03/07/2021   TRIG 67 03/07/2021   CHOLHDL 2.8 03/07/2021    Significant Diagnostic Results in last 30 days:  No results found.  Assessment/Plan 1. Annual physical exam Up to date with immunization due for tetanus and COVID booster vaccine.  Made aware to get vaccine at the pharmacy. medication and labs reviewed patient counselled regarding yearly exam, prevention of dental and periodontal disease, diet, regular sustained exercise for at least 30 minutes x 3 /week, recommended schedule for routine labs.  Had  some lab work done by [employer] but does  not recall which labs were done.  She will check lab work and then update provider. PHQ 2 up dated.  2. Essential hypertension Blood pressure well controlled -Continue on hydrochlorothiazide -Continue on statin daily for cardiovascular event prevention.  Consider adding baby aspirin - CBC with Differential/Platelet; Future - CMP with eGFR([facility]); Future - TSH; Future  3. Hyperlipidemia LDL goal <100 Previous LDL at goal -Continue dietary modification.Encourage to exercise at least 30 minutes 3 times per week - Lipid panel; Future  4. Need for Tdap vaccination Prescription sent to pharmacy in the previous visits but did not go to pharmacy to get vaccine.  We will resend prescription to pharmacy.Made aware to get vaccine at the pharmacy - Tdap ([place]) 5-2.5-18.5 LF-MCG/0.5 injection; Inject 0.5 mLs into the muscle once for 1 dose.  Dispense: 0.5 mL; Refill: 0  5. Hot flashes due to menopause She was seen yesterday by gynecologist estrogen cream given for trial mellowing notify gynecologist if effective to get a prescription. -Dietary modification and exercise advised -We will check thyroid level - TSH; Future  Family/ staff Communication: Reviewed plan of care with patient verbalized understanding  Labs/tests ordered:  - CBC with Differential/Platelet - CMP with eGFR([facility]) - - TSH; Future - Lipid panel  Next Appointment : Return in about 6 months (around 12/25/2022) for fasting labs in one week, medical mangement of chronic issues.Sandrea Hughs, NP

## 2022-07-03 ENCOUNTER — Other Ambulatory Visit: Payer: 59

## 2022-07-03 DIAGNOSIS — E785 Hyperlipidemia, unspecified: Secondary | ICD-10-CM

## 2022-07-03 DIAGNOSIS — I10 Essential (primary) hypertension: Secondary | ICD-10-CM

## 2022-07-04 LAB — CBC WITH DIFFERENTIAL/PLATELET
Absolute Monocytes: 270 cells/uL (ref 200–950)
Basophils Absolute: 30 cells/uL (ref 0–200)
Basophils Relative: 0.6 %
Eosinophils Absolute: 100 cells/uL (ref 15–500)
Eosinophils Relative: 2 %
HCT: 39.2 % (ref 35.0–45.0)
Hemoglobin: 13.3 g/dL (ref 11.7–15.5)
Lymphs Abs: 1775 cells/uL (ref 850–3900)
MCH: 32.4 pg (ref 27.0–33.0)
MCHC: 33.9 g/dL (ref 32.0–36.0)
MCV: 95.6 fL (ref 80.0–100.0)
MPV: 8.8 fL (ref 7.5–12.5)
Monocytes Relative: 5.4 %
Neutro Abs: 2825 cells/uL (ref 1500–7800)
Neutrophils Relative %: 56.5 %
Platelets: 388 10*3/uL (ref 140–400)
RBC: 4.1 10*6/uL (ref 3.80–5.10)
RDW: 12 % (ref 11.0–15.0)
Total Lymphocyte: 35.5 %
WBC: 5 10*3/uL (ref 3.8–10.8)

## 2022-07-04 LAB — COMPLETE METABOLIC PANEL WITH GFR
AG Ratio: 1.3 (calc) (ref 1.0–2.5)
ALT: 11 U/L (ref 6–29)
AST: 14 U/L (ref 10–35)
Albumin: 4 g/dL (ref 3.6–5.1)
Alkaline phosphatase (APISO): 46 U/L (ref 37–153)
BUN: 19 mg/dL (ref 7–25)
CO2: 29 mmol/L (ref 20–32)
Calcium: 9.1 mg/dL (ref 8.6–10.4)
Chloride: 103 mmol/L (ref 98–110)
Creat: 0.82 mg/dL (ref 0.50–1.03)
Globulin: 3 g/dL (calc) (ref 1.9–3.7)
Glucose, Bld: 86 mg/dL (ref 65–99)
Potassium: 4.2 mmol/L (ref 3.5–5.3)
Sodium: 139 mmol/L (ref 135–146)
Total Bilirubin: 0.6 mg/dL (ref 0.2–1.2)
Total Protein: 7 g/dL (ref 6.1–8.1)
eGFR: 85 mL/min/{1.73_m2} (ref >=60)

## 2022-07-04 LAB — LIPID PANEL
Cholesterol: 174 mg/dL (ref <200)
HDL: 66 mg/dL (ref >=50)
LDL Cholesterol (Calc): 94 mg/dL (calc)
Non-HDL Cholesterol (Calc): 108 mg/dL (calc) (ref <130)
Total CHOL/HDL Ratio: 2.6 (calc) (ref <5.0)
Triglycerides: 46 mg/dL (ref <150)

## 2022-07-04 LAB — TSH: TSH: 1.27 m[IU]/L

## 2022-08-30 ENCOUNTER — Other Ambulatory Visit: Payer: Self-pay | Admitting: Family

## 2022-08-30 DIAGNOSIS — I10 Essential (primary) hypertension: Secondary | ICD-10-CM

## 2022-11-05 ENCOUNTER — Other Ambulatory Visit: Payer: Self-pay | Admitting: Family

## 2022-11-05 DIAGNOSIS — E785 Hyperlipidemia, unspecified: Secondary | ICD-10-CM

## 2022-12-25 ENCOUNTER — Ambulatory Visit: Payer: 59 | Admitting: Family

## 2023-03-23 ENCOUNTER — Encounter: Payer: Self-pay | Admitting: Family

## 2023-06-02 ENCOUNTER — Other Ambulatory Visit: Payer: Self-pay | Admitting: Family

## 2023-06-02 DIAGNOSIS — I10 Essential (primary) hypertension: Secondary | ICD-10-CM

## 2023-06-02 DIAGNOSIS — E785 Hyperlipidemia, unspecified: Secondary | ICD-10-CM

## 2023-08-04 ENCOUNTER — Encounter: Payer: Self-pay | Admitting: Family

## 2023-08-04 ENCOUNTER — Ambulatory Visit (INDEPENDENT_AMBULATORY_CARE_PROVIDER_SITE_OTHER): Payer: 59 | Admitting: Family

## 2023-08-04 VITALS — BP 120/80 | HR 62 | Temp 97.3°F | Resp 14 | Ht 66.0 in | Wt 202.0 lb

## 2023-08-04 DIAGNOSIS — I10 Essential (primary) hypertension: Secondary | ICD-10-CM

## 2023-08-04 DIAGNOSIS — E785 Hyperlipidemia, unspecified: Secondary | ICD-10-CM | POA: Diagnosis not present

## 2023-08-04 DIAGNOSIS — Z23 Encounter for immunization: Secondary | ICD-10-CM

## 2023-08-04 DIAGNOSIS — Z0000 Encounter for general adult medical examination without abnormal findings: Secondary | ICD-10-CM

## 2023-08-04 NOTE — Progress Notes (Signed)
Provider: Richarda Blade FNP-C   Ronny Korff, Donalee Citrin, NP  Patient Care Team: Nusrat Encarnacion, Donalee Citrin, NP as PCP - General (Family Medicine) Estill Bamberg, MD as Consulting Physician (Orthopedic Surgery) Richarda Overlie, MD as Consulting Physician (Obstetrics and Gynecology)  Extended Emergency Contact Information Primary Emergency Contact: MCNEIL,CYNTHIA Mobile Phone: [phone] Relation: Sister  Code Status:  Full Code  Goals of care: Advanced Directive information    08/04/2023    2:09 PM  Advanced Directives  Does Patient Have a Medical Advance Directive? No     Chief Complaint  Patient presents with   Annual Exam    HPI:  Pt is a 57 y.o. female seen today for annual physical examination. She denies any acute issues.Lost her mother several months ago still going through grief.states has her three sisters and family to lean on.coping well.  Due for fasting labs came in fasting today  Also due for influenza and Tdap vaccine.   Past Medical History:  Diagnosis Date   Anemia    Hypertension    Past Surgical History:  Procedure Laterality Date   ABDOMINAL HYSTERECTOMY Bilateral 11/29/2015   Procedure: HYSTERECTOMY ABDOMINAL;  Surgeon: Richarda Overlie, MD;  Location: [facility];  Service: Gynecology;  Laterality: Bilateral;   BILATERAL SALPINGECTOMY Bilateral 11/29/2015   Procedure: BILATERAL SALPINGECTOMY;  Surgeon: Richarda Overlie, MD;  Location: [facility];  Service: Gynecology;  Laterality: Bilateral;   BREAST SURGERY  2007   reduction, Louisa Second, MD   VAGINAL DELIVERY     WISDOM TOOTH EXTRACTION Bilateral age - early 69s   all 4 removed    No Known Allergies  Allergies as of 08/04/2023   No Known Allergies      Medication List        Accurate as of August 04, 2023  2:30 PM. If you have any questions, ask your nurse or doctor.          hydrochlorothiazide 25 MG tablet Commonly known as: HYDRODIURIL TAKE 1 TABLET(25 MG) BY MOUTH DAILY   ibuprofen 200  MG tablet Commonly known as: ADVIL Take 200 mg by mouth every 6 (six) hours as needed.   loratadine 10 MG tablet Commonly known as: CLARITIN Take 10 mg by mouth as needed for allergies.   meloxicam 15 MG tablet Commonly known as: MOBIC Take 15 mg by mouth daily.   multivitamin tablet Take 1 tablet by mouth daily.   rosuvastatin 5 MG tablet Commonly known as: CRESTOR TAKE 1 TABLET(5 MG) BY MOUTH DAILY   valACYclovir 500 MG tablet Commonly known as: VALTREX Take 500 mg by mouth daily.        Review of Systems  Constitutional:  Negative for appetite change, chills, fatigue, fever and unexpected weight change.  HENT:  Negative for congestion, dental problem, ear discharge, ear pain, facial swelling, hearing loss, nosebleeds, postnasal drip, rhinorrhea, sinus pressure, sinus pain, sneezing, sore throat, tinnitus and trouble swallowing.   Eyes:  Negative for pain, discharge, redness, itching and visual disturbance.  Respiratory:  Negative for cough, chest tightness, shortness of breath and wheezing.   Cardiovascular:  Negative for chest pain, palpitations and leg swelling.  Gastrointestinal:  Negative for abdominal distention, abdominal pain, blood in stool, constipation, diarrhea, nausea and vomiting.  Endocrine: Negative for cold intolerance, heat intolerance, polydipsia, polyphagia and polyuria.  Genitourinary:  Negative for difficulty urinating, dysuria, flank pain, frequency and urgency.  Musculoskeletal:  Negative for arthralgias, back pain, gait problem, joint swelling, myalgias, neck pain and neck stiffness.  Skin:  Negative for color change, pallor, rash and wound.  Neurological:  Negative for dizziness, syncope, speech difficulty, weakness, light-headedness, numbness and headaches.  Hematological:  Does not bruise/bleed easily.  Psychiatric/Behavioral:  Negative for agitation, behavioral problems, confusion, hallucinations, self-injury, sleep disturbance and suicidal  ideas. The patient is not nervous/anxious.     Immunization History  Administered Date(s) Administered   Influenza Inj Mdck Quad Pf 09/19/2017   Influenza, Quadrivalent, Recombinant, Inj, Pf 09/24/2017   Influenza, Seasonal, Injecte, Preservative Fre 08/04/2023   Influenza,inj,Quad PF,6+ Mos 11/30/2015, 01/14/2019, 07/20/2019   Influenza-Unspecified 09/07/2014, 10/09/2020   PFIZER(Purple Top)SARS-COV-2 Vaccination 05/23/2020, 06/23/2020, 10/09/2020, 08/03/2021   Tdap 08/04/2023   Zoster Recombinant(Shingrix) 10/23/2021, 01/21/2022   Pertinent  Health Maintenance Due  Topic Date Due   Colonoscopy  11/08/2022   MAMMOGRAM  06/23/2024   INFLUENZA VACCINE  Completed      10/24/2020    3:36 PM 03/11/2021    4:54 PM 09/05/2021    2:48 PM 06/24/2022    3:38 PM 08/04/2023    2:10 PM  Fall Risk  Falls in the past year? 0 0 0 0 0  Was there an injury with Fall? 0 0 0 0   Fall Risk Category Calculator 0 0 0 0   Fall Risk Category (Retired) Low Low Low Low   (RETIRED) Patient Fall Risk Level Low fall risk Low fall risk Low fall risk Low fall risk   Patient at Risk for Falls Due to   No Fall Risks No Fall Risks   Fall risk Follow up   Falls evaluation completed;Education provided;Falls prevention discussed Falls evaluation completed    Functional Status Survey:    Vitals:   08/04/23 1410  BP: 120/80  Pulse: 62  Resp: 14  Temp: (!) 97.3 F (36.3 C)  SpO2: 99%  Weight: 202 lb (91.6 kg)  Height: 5\' 6"  (1.676 m)   Body mass index is 32.6 kg/m. Physical Exam Vitals reviewed.  Constitutional:      General: She is not in acute distress.    Appearance: Normal appearance. She is obese. She is not ill-appearing or diaphoretic.  HENT:     Head: Normocephalic.     Right Ear: Tympanic membrane, ear canal and external ear normal. There is no impacted cerumen.     Left Ear: Tympanic membrane, ear canal and external ear normal. There is no impacted cerumen.     Nose: Nose normal. No  congestion or rhinorrhea.     Mouth/Throat:     Mouth: Mucous membranes are moist.     Pharynx: Oropharynx is clear. No oropharyngeal exudate or posterior oropharyngeal erythema.  Eyes:     General: No scleral icterus.       Right eye: No discharge.        Left eye: No discharge.     Extraocular Movements: Extraocular movements intact.     Conjunctiva/sclera: Conjunctivae normal.     Pupils: Pupils are equal, round, and reactive to light.  Neck:     Vascular: No carotid bruit.  Cardiovascular:     Rate and Rhythm: Normal rate and regular rhythm.     Pulses: Normal pulses.     Heart sounds: Normal heart sounds. No murmur heard.    No friction rub. No gallop.  Pulmonary:     Effort: Pulmonary effort is normal. No respiratory distress.     Breath sounds: Normal breath sounds. No wheezing, rhonchi or rales.  Chest:     Chest wall: No tenderness.  Abdominal:     General: Bowel sounds are normal. There is no distension.     Palpations: Abdomen is soft. There is no mass.     Tenderness: There is no abdominal tenderness. There is no right CVA tenderness, left CVA tenderness, guarding or rebound.  Musculoskeletal:        General: No swelling or tenderness. Normal range of motion.     Cervical back: Normal range of motion. No rigidity or tenderness.     Right lower leg: No edema.     Left lower leg: No edema.  Lymphadenopathy:     Cervical: No cervical adenopathy.  Skin:    General: Skin is warm and dry.     Coloration: Skin is not pale.     Findings: No bruising, erythema, lesion or rash.  Neurological:     Mental Status: She is alert and oriented to person, place, and time.     Cranial Nerves: No cranial nerve deficit.     Sensory: No sensory deficit.     Motor: No weakness.     Coordination: Coordination normal.     Gait: Gait normal.  Psychiatric:        Mood and Affect: Mood normal.        Speech: Speech normal.        Behavior: Behavior normal.        Thought Content:  Thought content normal.        Judgment: Judgment normal.    Labs reviewed: No results for input(s): "NA", "K", "CL", "CO2", "GLUCOSE", "BUN", "CREATININE", "CALCIUM", "MG", "PHOS" in the last 8760 hours. No results for input(s): "AST", "ALT", "ALKPHOS", "BILITOT", "PROT", "ALBUMIN" in the last 8760 hours. No results for input(s): "WBC", "NEUTROABS", "HGB", "HCT", "MCV", "PLT" in the last 8760 hours. Lab Results  Component Value Date   TSH 1.27 07/03/2022   No results found for: "HGBA1C" Lab Results  Component Value Date   CHOL 174 07/03/2022   HDL 66 07/03/2022   LDLCALC 94 07/03/2022   TRIG 46 07/03/2022   CHOLHDL 2.6 07/03/2022    Significant Diagnostic Results in last 30 days:  No results found.  Assessment/Plan  1. Need for influenza vaccination Afebrile  Flut shot administered by CMA no acute reaction reported.  - Flu vaccine trivalent PF, 6mos and older(Flulaval,Afluria,Fluarix,Fluzone)  2. Need for tetanus, diphtheria, and acellular pertussis (Tdap) vaccine Tdap vaccine administered by CMA no acute reaction reported.  3. Essential hypertension B/p well controlled  - continue hydrochlorothiazide  - TSH - COMPLETE METABOLIC PANEL WITH GFR - CBC with Differential/Platelet  4. Hyperlipidemia LDL goal <100 LDL 94  - continue rosuvastatin  - Lipid panel  5. Annual physical exam Up to date with immunization due COVID-19 vaccine.Advised to get COVID-19 vaccine.Medication and labs reviewed patient counselled regarding yearly exam, prevention of dental and periodontal disease, diet, regular sustained exercise for at least 30 minutes x 3 /week. recommended schedule for routine labs.  Family/ staff Communication: Reviewed plan of care with patient verbalized understanding   Labs/tests ordered:  - TSH - COMPLETE METABOLIC PANEL WITH GFR - CBC with Differential/Platelet - Lipid panel  Next Appointment : Return in about 1 year (around 08/03/2024) for annual Physical  examination.   Caesar Bookman, NP

## 2023-08-05 LAB — LIPID PANEL
Cholesterol: 232 mg/dL — ABNORMAL HIGH (ref <200)
HDL: 60 mg/dL (ref >=50)
LDL Cholesterol (Calc): 154 mg/dL — ABNORMAL HIGH
Non-HDL Cholesterol (Calc): 172 mg/dL — ABNORMAL HIGH (ref <130)
Total CHOL/HDL Ratio: 3.9 (calc) (ref <5.0)
Triglycerides: 78 mg/dL (ref <150)

## 2023-08-05 LAB — CBC WITH DIFFERENTIAL/PLATELET
Absolute Monocytes: 300 {cells}/uL (ref 200–950)
Basophils Absolute: 30 {cells}/uL (ref 0–200)
Basophils Relative: 0.6 %
Eosinophils Absolute: 150 {cells}/uL (ref 15–500)
Eosinophils Relative: 3 %
HCT: 40.3 % (ref 35.0–45.0)
Hemoglobin: 13.2 g/dL (ref 11.7–15.5)
Lymphs Abs: 2210 {cells}/uL (ref 850–3900)
MCH: 31.8 pg (ref 27.0–33.0)
MCHC: 32.8 g/dL (ref 32.0–36.0)
MCV: 97.1 fL (ref 80.0–100.0)
MPV: 8.8 fL (ref 7.5–12.5)
Monocytes Relative: 6 %
Neutro Abs: 2310 {cells}/uL (ref 1500–7800)
Neutrophils Relative %: 46.2 %
Platelets: 414 10*3/uL — ABNORMAL HIGH (ref 140–400)
RBC: 4.15 10*6/uL (ref 3.80–5.10)
RDW: 12.1 % (ref 11.0–15.0)
Total Lymphocyte: 44.2 %
WBC: 5 10*3/uL (ref 3.8–10.8)

## 2023-08-05 LAB — COMPLETE METABOLIC PANEL WITH GFR
AG Ratio: 1.3 (calc) (ref 1.0–2.5)
ALT: 13 U/L (ref 6–29)
AST: 13 U/L (ref 10–35)
Albumin: 4.1 g/dL (ref 3.6–5.1)
Alkaline phosphatase (APISO): 44 U/L (ref 37–153)
BUN: 17 mg/dL (ref 7–25)
CO2: 28 mmol/L (ref 20–32)
Calcium: 9.2 mg/dL (ref 8.6–10.4)
Chloride: 102 mmol/L (ref 98–110)
Creat: 0.75 mg/dL (ref 0.50–1.03)
Globulin: 3.1 g/dL (ref 1.9–3.7)
Glucose, Bld: 77 mg/dL (ref 65–99)
Potassium: 4.1 mmol/L (ref 3.5–5.3)
Sodium: 139 mmol/L (ref 135–146)
Total Bilirubin: 0.6 mg/dL (ref 0.2–1.2)
Total Protein: 7.2 g/dL (ref 6.1–8.1)
eGFR: 94 mL/min/{1.73_m2} (ref >=60)

## 2023-08-05 LAB — TSH: TSH: 1.01 m[IU]/L

## 2023-08-07 ENCOUNTER — Telehealth: Payer: Self-pay | Admitting: Family

## 2023-08-07 NOTE — Telephone Encounter (Signed)
Patient called to say she read her MyChart message stating that her cholesterol levels were elevated and that you were going to increase her Crestor from 5 mg. to 10 mg. Patient was just calling to see when you were going to send that new script to the pharmacy.

## 2023-08-07 NOTE — Telephone Encounter (Signed)
CMA was going to call to see if you agree with plan to increase Crestor from 5 to 10 mg tablet.

## 2023-08-10 ENCOUNTER — Other Ambulatory Visit: Payer: Self-pay

## 2023-08-10 DIAGNOSIS — E7800 Pure hypercholesterolemia, unspecified: Secondary | ICD-10-CM

## 2023-08-10 MED ORDER — ROSUVASTATIN CALCIUM 10 MG PO TABS: 10.0000 mg | ORAL_TABLET | Freq: Every day | ORAL | 0 refills | Status: DC

## 2023-10-13 ENCOUNTER — Other Ambulatory Visit: Payer: Self-pay | Admitting: Family

## 2023-12-04 ENCOUNTER — Other Ambulatory Visit: Payer: Self-pay

## 2023-12-04 DIAGNOSIS — E7800 Pure hypercholesterolemia, unspecified: Secondary | ICD-10-CM

## 2023-12-10 ENCOUNTER — Other Ambulatory Visit: Payer: 59

## 2023-12-30 ENCOUNTER — Encounter: Payer: Self-pay | Admitting: Family

## 2023-12-30 ENCOUNTER — Ambulatory Visit (INDEPENDENT_AMBULATORY_CARE_PROVIDER_SITE_OTHER): Payer: 59 | Admitting: Family

## 2023-12-30 VITALS — BP 110/80 | HR 75 | Temp 98.2°F | Resp 20 | Ht 66.0 in | Wt 210.8 lb

## 2023-12-30 DIAGNOSIS — E66811 Obesity, class 1: Secondary | ICD-10-CM | POA: Diagnosis not present

## 2023-12-30 DIAGNOSIS — R21 Rash and other nonspecific skin eruption: Secondary | ICD-10-CM | POA: Diagnosis not present

## 2023-12-30 DIAGNOSIS — Z6834 Body mass index (BMI) 34.0-34.9, adult: Secondary | ICD-10-CM

## 2023-12-30 DIAGNOSIS — E6609 Other obesity due to excess calories: Secondary | ICD-10-CM

## 2023-12-30 DIAGNOSIS — E785 Hyperlipidemia, unspecified: Secondary | ICD-10-CM | POA: Diagnosis not present

## 2023-12-30 NOTE — Progress Notes (Signed)
 Provider: Roxan Plough FNP-C  Antinio Sanderfer, Roxan BROCKS, NP  Patient Care Team: Shamecka Hocutt, Roxan BROCKS, NP as PCP - General (Family Medicine) Beuford Anes, MD as Consulting Physician (Orthopedic Surgery) Johnnye Ade, MD as Consulting Physician (Obstetrics and Gynecology)  Extended Emergency Contact Information Primary Emergency Contact: MCNEIL,CYNTHIA Mobile Phone: [phone] Relation: Sister  Code Status:  Full Code  Goals of care: Advanced Directive information    12/30/2023    2:45 PM  Advanced Directives  Does Patient Have a Medical Advance Directive? No  Would patient like information on creating a medical advance directive? No - Patient declined     Chief Complaint  Patient presents with   Acute Visit    Bumps on face.   Discussed the use of AI scribe software for clinical note transcription with the patient, who gave verbal consent to proceed.  History of Present Illness   Penny Ward is a 58 year old female who presents with persistent skin lesions on the face.  She has had persistent skin lesions on her face for a few weeks. Initially, there was a single bump, but now there are multiple bumps, one of which is larger and has pus. The bumps are red and persistent, despite using acne pads and other treatments. She is concerned about the possibility of ringworm, especially since her boyfriend had a similar issue, although it was a discoloration and was treated with oral medication and topical spray.  Her boyfriend recently had a similar skin issue, which was diagnosed and treated at an urgent care. This prompted her to seek medical evaluation for her own skin condition.  She has been using a [employer]onal Murray skincare system and occasionally uses a product similar to Clearasil for acne. She also uses acne patches on the lesions.  No itching, pain, fever, or chills associated with the skin lesions. She mentions experiencing occasional hot flashes. She has a history of acne but notes  that these current lesions are different from her usual acne.   Past Medical History:  Diagnosis Date   Anemia    Hypertension    Past Surgical History:  Procedure Laterality Date   ABDOMINAL HYSTERECTOMY Bilateral 11/29/2015   Procedure: HYSTERECTOMY ABDOMINAL;  Surgeon: Ade Johnnye, MD;  Location: [facility];  Service: Gynecology;  Laterality: Bilateral;   BILATERAL SALPINGECTOMY Bilateral 11/29/2015   Procedure: BILATERAL SALPINGECTOMY;  Surgeon: Ade Johnnye, MD;  Location: [facility];  Service: Gynecology;  Laterality: Bilateral;   BREAST SURGERY  2007   reduction, Elna Pick, MD   VAGINAL DELIVERY     WISDOM TOOTH EXTRACTION Bilateral age - early 14s   all 4 removed    No Known Allergies  Outpatient Encounter Medications as of 12/30/2023  Medication Sig   hydrochlorothiazide  (HYDRODIURIL ) 25 MG tablet TAKE 1 TABLET(25 MG) BY MOUTH DAILY   ibuprofen  (ADVIL ,MOTRIN ) 200 MG tablet Take 200 mg by mouth every 6 (six) hours as needed.   meloxicam (MOBIC) 15 MG tablet Take 15 mg by mouth daily.   Multiple Vitamin (MULTIVITAMIN) tablet Take 1 tablet by mouth daily.   rosuvastatin  (CRESTOR ) 10 MG tablet TAKE 1 TABLET(10 MG) BY MOUTH DAILY   valACYclovir (VALTREX) 500 MG tablet Take 500 mg by mouth daily.   loratadine  (CLARITIN ) 10 MG tablet Take 10 mg by mouth as needed for allergies. (Patient not taking: Reported on 12/30/2023)   No facility-administered encounter medications on file as of 12/30/2023.    Review of Systems  Constitutional:  Negative for appetite change, chills,  fatigue, fever and unexpected weight change.  HENT:  Negative for congestion, dental problem, ear discharge, ear pain, facial swelling, hearing loss, nosebleeds, postnasal drip, rhinorrhea, sinus pressure, sinus pain, sneezing and sore throat.   Eyes:  Negative for pain, discharge, redness, itching and visual disturbance.  Respiratory:  Negative for cough, chest tightness, shortness of breath and wheezing.    Cardiovascular:  Negative for chest pain, palpitations and leg swelling.  Gastrointestinal:  Negative for abdominal distention, abdominal pain, blood in stool, constipation, diarrhea, nausea and vomiting.  Musculoskeletal:  Negative for arthralgias, back pain, gait problem, joint swelling and myalgias.  Skin:  Positive for rash. Negative for color change, pallor and wound.    Immunization History  Administered Date(s) Administered   Influenza Inj Mdck Quad Pf 09/19/2017   Influenza, Quadrivalent, Recombinant, Inj, Pf 09/24/2017   Influenza, Seasonal, Injecte, Preservative Fre 08/04/2023   Influenza,inj,Quad PF,6+ Mos 11/30/2015, 01/14/2019, 07/20/2019   Influenza-Unspecified 09/07/2014, 10/09/2020   PFIZER(Purple Top)SARS-COV-2 Vaccination 05/23/2020, 06/23/2020, 10/09/2020, 08/03/2021   Tdap 08/04/2023   Zoster Recombinant(Shingrix) 10/23/2021, 01/21/2022   Pertinent  Health Maintenance Due  Topic Date Due   Colonoscopy  11/08/2022   MAMMOGRAM  06/23/2024   INFLUENZA VACCINE  Completed      03/11/2021    4:54 PM 09/05/2021    2:48 PM 06/24/2022    3:38 PM 08/04/2023    2:10 PM 12/30/2023    2:45 PM  Fall Risk  Falls in the past year? 0 0 0 0 0  Was there an injury with Fall? 0 0 0  0  Fall Risk Category Calculator 0 0 0  0  Fall Risk Category (Retired) Low Low Low    (RETIRED) Patient Fall Risk Level Low fall risk Low fall risk Low fall risk    Patient at Risk for Falls Due to  No Fall Risks No Fall Risks    Fall risk Follow up  Falls evaluation completed;Education provided;Falls prevention discussed Falls evaluation completed     Functional Status Survey:    Vitals:   12/30/23 1447  BP: 110/80  Pulse: 75  Resp: 20  Temp: 98.2 F (36.8 C)  SpO2: 97%  Weight: 210 lb 12.8 oz (95.6 kg)  Height: 5' 6 (1.676 m)   Body mass index is 34.02 kg/m. Physical Exam Vitals reviewed.  Constitutional:      General: She is not in acute distress.    Appearance: Normal  appearance. She is obese. She is not ill-appearing or diaphoretic.  HENT:     Head: Normocephalic.     Mouth/Throat:     Mouth: Mucous membranes are moist.     Pharynx: Oropharynx is clear. No oropharyngeal exudate or posterior oropharyngeal erythema.  Eyes:     General: No scleral icterus.       Right eye: No discharge.        Left eye: No discharge.     Conjunctiva/sclera: Conjunctivae normal.     Pupils: Pupils are equal, round, and reactive to light.  Cardiovascular:     Rate and Rhythm: Normal rate and regular rhythm.     Pulses: Normal pulses.     Heart sounds: Normal heart sounds. No murmur heard.    No friction rub. No gallop.  Pulmonary:     Effort: Pulmonary effort is normal. No respiratory distress.     Breath sounds: Normal breath sounds. No wheezing, rhonchi or rales.  Chest:     Chest wall: No tenderness.  Abdominal:  General: Bowel sounds are normal. There is no distension.     Palpations: Abdomen is soft. There is no mass.     Tenderness: There is no abdominal tenderness. There is no right CVA tenderness, left CVA tenderness, guarding or rebound.  Musculoskeletal:        General: No swelling or tenderness. Normal range of motion.     Cervical back: Normal range of motion. No rigidity or tenderness.     Right lower leg: No edema.     Left lower leg: No edema.  Lymphadenopathy:     Cervical: No cervical adenopathy.  Skin:    General: Skin is warm and dry.     Coloration: Skin is not pale.     Findings: No bruising, erythema or lesion.     Comments: Three bumps noted, without any pus, erythema or central clearing. No insect bite marks observed.    Neurological:     Mental Status: She is alert and oriented to person, place, and time.     Motor: No weakness.     Gait: Gait normal.  Psychiatric:        Mood and Affect: Mood normal.        Speech: Speech normal.        Behavior: Behavior normal.        Labs reviewed: Recent Labs    08/04/23 1446  NA  139  K 4.1  CL 102  CO2 28  GLUCOSE 77  BUN 17  CREATININE 0.75  CALCIUM  9.2   Recent Labs    08/04/23 1446  AST 13  ALT 13  BILITOT 0.6  PROT 7.2   Recent Labs    08/04/23 1446  WBC 5.0  NEUTROABS 2,310  HGB 13.2  HCT 40.3  MCV 97.1  PLT 414*   Lab Results  Component Value Date   TSH 1.01 08/04/2023   No results found for: HGBA1C Lab Results  Component Value Date   CHOL 232 (H) 08/04/2023   HDL 60 08/04/2023   LDLCALC 154 (H) 08/04/2023   TRIG 78 08/04/2023   CHOLHDL 3.9 08/04/2023    Significant Diagnostic Results in last 30 days:  No results found.  Assessment/Plan  Rash and nonspecific skin eruption  Presents with persistent facial lesions, described as bumps with occasional pus and redness, present for a few weeks. Differential diagnosis includes acne and ringworm, but the appearance is not typical of ringworm. Over-the-counter acne treatments have been ineffective. Discussed cortisone cream use and the risk of skin thinning with overuse.   - Apply a small amount of cortisone cream to the affected area, cautioning against overuse due to potential skin thinning.   - Refer to a dermatologist if lesions do not improve.    Class 1 obesity due to excess calories with serious comorbidity and body mass index (BMI) of 34.0 to 34.9 in adult (Primary) Current BMI is 34, with weight gain since the last visit. Discussed weight loss medications like Ozempic and Wegovy, their effectiveness, high cost, and the importance of diet and exercise to prevent weight regain post-medication. Explained potential insurance coverage issues and rapid weight regain without lifestyle changes.   - Refer to [facility] weight management program for specialized diet and exercise monitoring.   - Encourage continued exercise and healthy eating habits. - Amb Ref to Medical Weight Management  Hyperlipidemia LDL goal <100  Cholesterol levels were elevated in the last lab work (September).  Discussed incorporating cholesterol management into the weight management  plan.   - Monitor cholesterol levels in future lab work.   - Incorporate cholesterol management into the weight management plan.   General Health Maintenance   Routine follow-up and health maintenance discussed. On estradiol for menopause management.   - Schedule a routine follow-up appointment in April.    Family/ staff Communication: Reviewed plan of care with patient verbalized understanding   Labs/tests ordered: None   Next Appointment:Return if symptoms worsen or fail to improve.    Total time: 20 minutes. Greater than 50% of total time spent doing patient education regarding Rash,weight gain, HLD,health maintenance including symptom/medication management.   Roxan JAYSON Plough, NP

## 2024-01-01 ENCOUNTER — Telehealth: Payer: Self-pay

## 2024-01-01 NOTE — Telephone Encounter (Signed)
 Patient called and states she was seen 2 days ago for a possible ringworm. Patient states she wasn't given anything but was told to call if it get worse. Patient states she can now see a circle around the rash and its on her face

## 2024-01-13 ENCOUNTER — Encounter: Payer: 59 | Admitting: Bariatrics

## 2024-01-15 ENCOUNTER — Other Ambulatory Visit: Payer: Self-pay | Admitting: Family

## 2024-01-27 ENCOUNTER — Ambulatory Visit: Payer: 59 | Admitting: Bariatrics

## 2024-01-27 ENCOUNTER — Encounter: Payer: Self-pay | Admitting: Bariatrics

## 2024-01-27 VITALS — BP 135/76 | HR 71 | Temp 98.3°F | Ht 66.0 in | Wt 205.0 lb

## 2024-01-27 DIAGNOSIS — E66811 Obesity, class 1: Secondary | ICD-10-CM

## 2024-01-27 DIAGNOSIS — Z6833 Body mass index (BMI) 33.0-33.9, adult: Secondary | ICD-10-CM

## 2024-01-27 DIAGNOSIS — E785 Hyperlipidemia, unspecified: Secondary | ICD-10-CM | POA: Diagnosis not present

## 2024-01-27 DIAGNOSIS — I10 Essential (primary) hypertension: Secondary | ICD-10-CM | POA: Diagnosis not present

## 2024-01-27 DIAGNOSIS — Z9189 Other specified personal risk factors, not elsewhere classified: Secondary | ICD-10-CM | POA: Insufficient documentation

## 2024-01-27 DIAGNOSIS — Z0289 Encounter for other administrative examinations: Secondary | ICD-10-CM

## 2024-01-27 DIAGNOSIS — E669 Obesity, unspecified: Secondary | ICD-10-CM | POA: Diagnosis not present

## 2024-01-27 DIAGNOSIS — Z90710 Acquired absence of both cervix and uterus: Secondary | ICD-10-CM | POA: Insufficient documentation

## 2024-01-27 NOTE — Progress Notes (Unsigned)
 Office: [phone]  /  Fax: [phone]   Initial Visit  Penny Ward was seen in clinic today to evaluate for obesity. She is interested in losing weight to improve overall health and reduce the risk of weight related complications. She presents today to review program treatment options, initial physical assessment, and evaluation.     She was referred by: PCP  When asked what else they would like to accomplish? She states: Adopt healthier eating patterns, Improve energy levels and physical activity, Improve existing medical conditions, and Improve quality of life  When asked how has your weight affected you? She states: Contributed to orthopedic problems or mobility issues, Having fatigue, and Having poor endurance  Some associated conditions: Hypertension and Hyperlipidemia  Contributing factors: Family history of obesity MGM  Weight promoting medications identified: None  Current nutrition plan: Low-carb, Portion control / smart choices, and Other: varied options.  Current level of physical activity: None and Walking 15-30 minutes, up to 45 minutes.   Current or previous pharmacotherapy: None and Is interested in pharmacotherapy  Response to medication: Never tried medications   Past medical history includes:   Past Medical History:  Diagnosis Date   Anemia    Hypertension      Objective:   BP 135/76   Pulse 71   Temp 98.3 F (36.8 C)   Ht 5\' 6"  (1.676 m)   Wt 205 lb (93 kg)   LMP 11/24/2015 (Exact Date)   SpO2 98%   BMI 33.09 kg/m  She was weighed on the bioimpedance scale: Body mass index is 33.09 kg/m.  Peak Weight:210 lbs , Body Fat%:41.4 %, Visceral Fat Rating:11, Weight trend over the last 12 months: Increasing  General:  Alert, oriented and cooperative. Patient is in no acute distress.  Respiratory: Normal respiratory effort, no problems with respiration noted  Extremities: Normal range of motion.    Mental Status: Normal mood and affect. Normal  behavior. Normal judgment and thought content.   DIAGNOSTIC DATA REVIEWED:  BMET    Component Value Date/Time   NA 139 08/04/2023 1446   NA 139 01/03/2015 0929   K 4.1 08/04/2023 1446   CL 102 08/04/2023 1446   CO2 28 08/04/2023 1446   GLUCOSE 77 08/04/2023 1446   BUN 17 08/04/2023 1446   BUN 14 01/03/2015 0929   CREATININE 0.75 08/04/2023 1446   CALCIUM 9.2 08/04/2023 1446   GFRNONAA 102 03/07/2021 1529   GFRAA 118 03/07/2021 1529   No results found for: "HGBA1C" No results found for: "INSULIN" CBC    Component Value Date/Time   WBC 5.0 08/04/2023 1446   RBC 4.15 08/04/2023 1446   HGB 13.2 08/04/2023 1446   HCT 40.3 08/04/2023 1446   PLT 414 (H) 08/04/2023 1446   MCV 97.1 08/04/2023 1446   MCH 31.8 08/04/2023 1446   MCHC 32.8 08/04/2023 1446   RDW 12.1 08/04/2023 1446   RDW 13.0 01/03/2015 0929   Iron/TIBC/Ferritin/ %Sat    Component Value Date/Time   IRON 16 (L) 01/03/2015 0929   TIBC 494 (H) 01/03/2015 0929   FERRITIN 5 (L) 01/03/2015 0929   IRONPCTSAT 3 (LL) 01/03/2015 0929   Lipid Panel     Component Value Date/Time   CHOL 232 (H) 08/04/2023 1446   CHOL 225 (H) 01/03/2015 0929   TRIG 78 08/04/2023 1446   HDL 60 08/04/2023 1446   HDL 82 01/03/2015 0929   CHOLHDL 3.9 08/04/2023 1446   LDLCALC 154 (H) 08/04/2023 1446   Hepatic  Function Panel     Component Value Date/Time   PROT 7.2 08/04/2023 1446   PROT 7.1 01/03/2015 0929   ALBUMIN 4.1 01/03/2015 0929   AST 13 08/04/2023 1446   ALT 13 08/04/2023 1446   ALKPHOS 45 01/03/2015 0929   BILITOT 0.6 08/04/2023 1446   BILITOT 0.2 01/03/2015 0929   BILIDIR 0.1 02/22/2020 1539   IBILI 0.4 02/22/2020 1539      Component Value Date/Time   TSH 1.01 08/04/2023 1446     Assessment and Plan:   Hyperlipidemia LDL is not at goal. Medication(s): Crestor  Cardiovascular risk factors: dyslipidemia, hypertension, obesity (BMI >= 30 kg/m2), and sedentary lifestyle  Lab Results  Component Value Date    CHOL 232 (H) 08/04/2023   HDL 60 08/04/2023   LDLCALC 154 (H) 08/04/2023   TRIG 78 08/04/2023   CHOLHDL 3.9 08/04/2023   Lab Results  Component Value Date   ALT 13 08/04/2023   AST 13 08/04/2023   ALKPHOS 45 01/03/2015   BILITOT 0.6 08/04/2023   The 10-year ASCVD risk score (Arnett DK, et al., 2019) is: 6.2%   Values used to calculate the score:     Age: 86 years     Sex: Female     Is Non-Hispanic African American: Yes     Diabetic: No     Tobacco smoker: No     Systolic Blood Pressure: 135 mmHg     Is BP treated: Yes     HDL Cholesterol: 60 mg/dL     Total Cholesterol: 232 mg/dL  Plan:  Continue statin.  Will avoid all trans fats.  Will read labels Will minimize saturated fats except the following: low fat meats in moderation, diary, and limited dark chocolate.  Increase Omega 3 in foods, and consider an Omega 3 supplement.  Will begin the program with the plan and exercise.       Hypertension Hypertension well controlled.  Medication(s): Hydrochlorothiazide 25 mg daily   BP Readings from Last 3 Encounters:  01/27/24 135/76  12/30/23 110/80  08/04/23 120/80   Lab Results  Component Value Date   CREATININE 0.75 08/04/2023   CREATININE 0.82 07/03/2022   CREATININE 0.66 03/07/2021   No results found for: "GFR"  Plan: Continue all antihypertensives at current dosages. Will check her blood pressure at home.  No added salt. Will keep sodium content to 1,500 mg or less per day.     Generalized Obesity: Current BMI 33.09    Obesity Treatment / Action Plan:  Patient will work on garnering support from family and friends to begin weight loss journey. Will work on eliminating or reducing the presence of highly palatable, calorie dense foods in the home. Will complete provided nutritional and psychosocial assessment questionnaire before the next appointment. Will be scheduled for indirect calorimetry to determine resting energy expenditure in a fasting state.   This will allow [place] to create a reduced calorie, high-protein meal plan to promote loss of fat mass while preserving muscle mass. Counseled on the health benefits of losing 5%-15% of total body weight. Was counseled on nutritional approaches to weight loss and benefits of reducing processed foods and consuming plant-based foods and high quality protein as part of nutritional weight management. Was counseled on pharmacotherapy and role as an adjunct in weight management.   Obesity Education Performed Today:  She was weighed on the bioimpedance scale and results were discussed and documented in the synopsis.  We discussed obesity as a disease and the importance  of a more detailed evaluation of all the factors contributing to the disease.  We discussed the importance of long term lifestyle changes which include nutrition, exercise and behavioral modifications as well as the importance of customizing this to her specific health and social needs.  We discussed the benefits of reaching a healthier weight to alleviate the symptoms of existing conditions and reduce the risks of the biomechanical, metabolic and psychological effects of obesity.  Discussed New Patient/Late Arrival, and Cancellation Policies. Patient voiced understanding and allowed to ask questions.   TERILYN SANO appears to be in the action stage of change and states they are ready to start intensive lifestyle modifications and behavioral modifications.  43 minutes was spent today on this visit including the above counseling, pre-visit chart review, and post-visit documentation.  Reviewed by clinician on day of visit: allergies, medications, problem list, medical history, surgical history, family history, social history, and previous encounter notes.    Kesean Serviss A. Lorretta HarpO.

## 2024-02-11 ENCOUNTER — Ambulatory Visit: Admitting: Bariatrics

## 2024-02-25 ENCOUNTER — Ambulatory Visit: Admitting: Bariatrics

## 2024-04-22 ENCOUNTER — Other Ambulatory Visit: Payer: Self-pay | Admitting: Family

## 2024-06-01 ENCOUNTER — Other Ambulatory Visit: Payer: Self-pay | Admitting: Family

## 2024-06-01 DIAGNOSIS — E785 Hyperlipidemia, unspecified: Secondary | ICD-10-CM

## 2024-06-01 DIAGNOSIS — I10 Essential (primary) hypertension: Secondary | ICD-10-CM

## 2024-08-05 ENCOUNTER — Encounter: Payer: Self-pay | Admitting: Family

## 2024-08-05 ENCOUNTER — Ambulatory Visit: Admitting: Family

## 2024-08-05 VITALS — BP 128/78 | HR 59 | Temp 97.6°F | Resp 20 | Ht 66.0 in | Wt 203.2 lb

## 2024-08-05 DIAGNOSIS — E66811 Obesity, class 1: Secondary | ICD-10-CM

## 2024-08-05 DIAGNOSIS — E785 Hyperlipidemia, unspecified: Secondary | ICD-10-CM

## 2024-08-05 DIAGNOSIS — Z6834 Body mass index (BMI) 34.0-34.9, adult: Secondary | ICD-10-CM

## 2024-08-05 DIAGNOSIS — Z23 Encounter for immunization: Secondary | ICD-10-CM

## 2024-08-05 DIAGNOSIS — I10 Essential (primary) hypertension: Secondary | ICD-10-CM | POA: Diagnosis not present

## 2024-08-05 DIAGNOSIS — E6609 Other obesity due to excess calories: Secondary | ICD-10-CM

## 2024-08-05 MED ORDER — HYDROCHLOROTHIAZIDE 25 MG PO TABS: 25.0000 mg | ORAL_TABLET | Freq: Every day | ORAL | 1 refills | Status: AC

## 2024-08-05 MED ORDER — ROSUVASTATIN CALCIUM 10 MG PO TABS
10.0000 mg | ORAL_TABLET | Freq: Every day | ORAL | 1 refills | Status: AC
Start: 2024-08-05 — End: ?

## 2024-08-05 NOTE — Progress Notes (Signed)
 Provider: Roxan Plough FNP-C   Symphonie Schneiderman, Roxan BROCKS, NP  Patient Care Team: Meleny Tregoning, Roxan BROCKS, NP as PCP - General (Family Medicine) Beuford Anes, MD as Consulting Physician (Orthopedic Surgery) Johnnye Ade, MD as Consulting Physician (Obstetrics and Gynecology)  Extended Emergency Contact Information Primary Emergency Contact: MCNEIL,CYNTHIA Mobile Phone: [phone] Relation: Sister  Code Status:  Full Code  Goals of care: Advanced Directive information    08/05/2024    2:45 PM  Advanced Directives  Does Patient Have a Medical Advance Directive? No  Would patient like information on creating a medical advance directive? No - Patient declined     Chief Complaint  Patient presents with   Annual Exam    Physical.    Discussed the use of AI scribe software for clinical note transcription with the patient, who gave verbal consent to proceed.  History of Present Illness   Penny Ward is a 58 year old female who presents for a routine follow-up visit.  She has been attending the gym regularly at [facility] three times a week for the past two to three months, engaging in weight machines and 30 minutes on the elliptical, contributing to weight loss from 205 pounds. Her sleep schedule is affected by her work hours, averaging five to six hours of sleep per night, but she feels she sleeps well when she does sleep.  Her current medications include ibuprofen , a multivitamin, Valtrex, hydrochlorothiazide  25 mg daily, and rosuvastatin  10 mg. She has discontinued Claritin  and meloxicam as she has not experienced allergies or knee pain recently.  She recalls a colonoscopy in 2020 following a positive Cologuard test, which revealed polyps that were removed and found to be non-cancerous. She has a family history of polyps, as both her older sisters have had them.  No issues with anxiety or depression. Her bowels are moving well. Her ankles still swell, but she tries to move more at  work to alleviate this and wears compression socks to help with the swelling.  She has received the tetanus and shingles vaccines and has had the COVID vaccine.    Past Medical History:  Diagnosis Date   Anemia    Hypertension    Past Surgical History:  Procedure Laterality Date   ABDOMINAL HYSTERECTOMY Bilateral 11/29/2015   Procedure: HYSTERECTOMY ABDOMINAL;  Surgeon: Ade Johnnye, MD;  Location: [facility];  Service: Gynecology;  Laterality: Bilateral;   BILATERAL SALPINGECTOMY Bilateral 11/29/2015   Procedure: BILATERAL SALPINGECTOMY;  Surgeon: Ade Johnnye, MD;  Location: [facility];  Service: Gynecology;  Laterality: Bilateral;   BREAST SURGERY  2007   reduction, Elna Pick, MD   VAGINAL DELIVERY     WISDOM TOOTH EXTRACTION Bilateral age - early 65s   all 4 removed    No Known Allergies  Allergies as of 08/05/2024   No Known Allergies      Medication List        Accurate as of August 05, 2024  7:56 PM. If you have any questions, ask your nurse or doctor.          STOP taking these medications    loratadine  10 MG tablet Commonly known as: CLARITIN  Stopped by: Roxan BROCKS Stanislaus Kaltenbach   meloxicam 15 MG tablet Commonly known as: MOBIC Stopped by: Danaisha Celli C Ronnie Doo       TAKE these medications    hydrochlorothiazide  25 MG tablet Commonly known as: HYDRODIURIL  Take 1 tablet (25 mg total) by mouth daily. What changed: See the new instructions. Changed  by: Adelena Desantiago C Ivannia Willhelm   ibuprofen  200 MG tablet Commonly known as: ADVIL  Take 200 mg by mouth every 6 (six) hours as needed.   multivitamin tablet Take 1 tablet by mouth daily.   rosuvastatin  10 MG tablet Commonly known as: CRESTOR  Take 1 tablet (10 mg total) by mouth daily. What changed: See the new instructions. Changed by: Akeema Broder C Skylor Schnapp   valACYclovir 500 MG tablet Commonly known as: VALTREX Take 500 mg by mouth daily.        Review of Systems  Constitutional:  Negative for appetite change,  chills, fatigue, fever and unexpected weight change.  HENT:  Negative for congestion, dental problem, ear discharge, ear pain, facial swelling, hearing loss, nosebleeds, postnasal drip, rhinorrhea, sinus pressure, sinus pain, sneezing, sore throat, tinnitus and trouble swallowing.   Eyes:  Negative for pain, discharge, redness, itching and visual disturbance.  Respiratory:  Negative for cough, chest tightness, shortness of breath and wheezing.   Cardiovascular:  Positive for leg swelling. Negative for chest pain and palpitations.  Gastrointestinal:  Negative for abdominal distention, abdominal pain, blood in stool, constipation, diarrhea, nausea and vomiting.  Endocrine: Negative for cold intolerance, heat intolerance, polydipsia, polyphagia and polyuria.  Genitourinary:  Negative for difficulty urinating, dysuria, flank pain, frequency and urgency.  Musculoskeletal:  Negative for arthralgias, back pain, gait problem, joint swelling, myalgias, neck pain and neck stiffness.  Skin:  Negative for color change, pallor, rash and wound.  Neurological:  Negative for dizziness, syncope, speech difficulty, weakness, light-headedness, numbness and headaches.  Hematological:  Does not bruise/bleed easily.  Psychiatric/Behavioral:  Negative for agitation, behavioral problems, confusion, hallucinations, self-injury, sleep disturbance and suicidal ideas. The patient is not nervous/anxious.     Immunization History  Administered Date(s) Administered   Influenza Inj Mdck Quad Pf 09/19/2017   Influenza, Quadrivalent, Recombinant, Inj, Pf 09/24/2017   Influenza, Seasonal, Injecte, Preservative Fre 08/04/2023, 08/05/2024   Influenza,inj,Quad PF,6+ Mos 11/30/2015, 01/14/2019, 07/20/2019   Influenza-Unspecified 09/07/2014, 10/09/2020   PFIZER(Purple Top)SARS-COV-2 Vaccination 05/23/2020, 06/23/2020, 10/09/2020, 08/03/2021   PNEUMOCOCCAL CONJUGATE-20 08/05/2024   Tdap 08/04/2023   Zoster Recombinant(Shingrix)  10/23/2021, 01/21/2022   Pertinent  Health Maintenance Due  Topic Date Due   Colonoscopy  11/08/2022   Mammogram  06/23/2024   Influenza Vaccine  Completed      09/05/2021    2:48 PM 06/24/2022    3:38 PM 08/04/2023    2:10 PM 12/30/2023    2:45 PM 08/05/2024    2:45 PM  Fall Risk  Falls in the past year? 0 0 0 0 0  Was there an injury with Fall? 0 0  0 0  Fall Risk Category Calculator 0 0  0 0  Fall Risk Category (Retired) Low  Low      (RETIRED) Patient Fall Risk Level Low fall risk  Low fall risk      Patient at Risk for Falls Due to No Fall Risks No Fall Risks   No Fall Risks  Fall risk Follow up Falls evaluation completed;Education provided;Falls prevention discussed  Falls evaluation completed    Falls evaluation completed     Data saved with a previous flowsheet row definition   Functional Status Survey:    Vitals:   08/05/24 1447  BP: 128/78  Pulse: (!) 59  Resp: 20  Temp: 97.6 F (36.4 C)  SpO2: 97%  Weight: 203 lb 3.2 oz (92.2 kg)  Height: 5' 6 (1.676 m)   Body mass index is 32.8 kg/m. Physical Exam VITALS: T-  97.6, P- 57, BP- 128/78 GENERAL: Alert, cooperative, well developed, no acute distress HEENT: Normocephalic, normal oropharynx, moist mucous membranes, no sinus tenderness NECK: Neck supple CHEST: Clear to auscultation bilaterally, no wheezes, rhonchi, or crackles CARDIOVASCULAR: Normal heart rate and rhythm, S1 and S2 normal without murmurs ABDOMEN: Soft, non-tender, non-distended, without organomegaly, normal bowel sounds EXTREMITIES: No cyanosis, ankle swelling noted MUSCULOSKELETAL: Hips and knees normal range of motion NEUROLOGICAL: Cranial nerves grossly intact, extraocular movements intact, moves all extremities without gross motor or sensory deficit  SKIN: No rash,no lesion or erythema   PSYCHIATRY/BEHAVIORAL: Mood stable    Labs reviewed: No results for input(s): NA, K, CL, CO2, GLUCOSE, BUN, CREATININE, CALCIUM , MG,  PHOS in the last 8760 hours. No results for input(s): AST, ALT, ALKPHOS, BILITOT, PROT, ALBUMIN in the last 8760 hours. No results for input(s): WBC, NEUTROABS, HGB, HCT, MCV, PLT in the last 8760 hours. Lab Results  Component Value Date   TSH 1.01 08/04/2023   No results found for: HGBA1C Lab Results  Component Value Date   CHOL 232 (H) 08/04/2023   HDL 60 08/04/2023   LDLCALC 154 (H) 08/04/2023   TRIG 78 08/04/2023   CHOLHDL 3.9 08/04/2023    Significant Diagnostic Results in last 30 days:  No results found.  Assessment/Plan     Annual Physical Exam  Due for pneumonia and influenza vaccines. Discussed importance due to age and upcoming flu season. Hepatitis B and COVID vaccines discussed for pharmacy administration. - Administer pneumonia vaccine today - Administer influenza vaccine today - Recommend obtaining hepatitis B vaccine at the pharmacy - Recommend obtaining COVID vaccine at the pharmacy in two weeks Colonoscopy in 2020 with polyp removal. Family history of polyps in siblings. Advised routine screening with gastroenterology due to polyps. - Call [facility] to schedule follow-up colonoscopy - Contact office if referral needed  Essential hypertension Blood pressure well-controlled at 128/78 mmHg on hydrochlorothiazide  25 mg daily. Emphasized healthy lifestyle to maintain normal blood pressure.  Hyperlipidemia Managed with rosuvastatin  10 mg daily. Emphasized lifestyle modifications, including regular exercise and healthy diet, to manage cholesterol levels.  Morbid Obesity,  BMI 32.8 with associated comorbidity HTN and HLD Actively managing weight through regular exercise at the gym three times a week, including weight machines and elliptical training. Encouraged continuation for weight loss and health improvement.  Lower extremity edema Reports occasional ankle swelling managed by frequent movement at work and wearing  compression socks. No edema noted on exam   Family/ staff Communication: Reviewed plan of care with patient verbalized understanding   Labs/tests ordered:  - CBC with Differential/Platelet - CMP with eGFR([facility]) - TSH - Lipid panel  Next Appointment : Return in about 6 months (around 02/02/2025) for medical mangement of chronic issues..   Kashvi Prevette C Aerielle Stoklosa, NP

## 2024-08-06 LAB — COMPREHENSIVE METABOLIC PANEL WITH GFR
AG Ratio: 1.4 (calc) (ref 1.0–2.5)
ALT: 11 U/L (ref 6–29)
AST: 14 U/L (ref 10–35)
Albumin: 4.4 g/dL (ref 3.6–5.1)
Alkaline phosphatase (APISO): 40 U/L (ref 37–153)
BUN: 17 mg/dL (ref 7–25)
CO2: 30 mmol/L (ref 20–32)
Calcium: 9.5 mg/dL (ref 8.6–10.4)
Chloride: 102 mmol/L (ref 98–110)
Creat: 0.77 mg/dL (ref 0.50–1.03)
Globulin: 3.1 g/dL (ref 1.9–3.7)
Glucose, Bld: 86 mg/dL (ref 65–99)
Potassium: 4.1 mmol/L (ref 3.5–5.3)
Sodium: 139 mmol/L (ref 135–146)
Total Bilirubin: 0.6 mg/dL (ref 0.2–1.2)
Total Protein: 7.5 g/dL (ref 6.1–8.1)
eGFR: 90 mL/min/1.73m2 (ref >=60)

## 2024-08-06 LAB — CBC WITH DIFFERENTIAL/PLATELET
Absolute Lymphocytes: 1886 {cells}/uL (ref 850–3900)
Absolute Monocytes: 331 {cells}/uL (ref 200–950)
Basophils Absolute: 29 {cells}/uL (ref 0–200)
Basophils Relative: 0.6 %
Eosinophils Absolute: 130 {cells}/uL (ref 15–500)
Eosinophils Relative: 2.7 %
HCT: 40.5 % (ref 35.0–45.0)
Hemoglobin: 13.4 g/dL (ref 11.7–15.5)
MCH: 32.8 pg (ref 27.0–33.0)
MCHC: 33.1 g/dL (ref 32.0–36.0)
MCV: 99 fL (ref 80.0–100.0)
MPV: 9 fL (ref 7.5–12.5)
Monocytes Relative: 6.9 %
Neutro Abs: 2424 {cells}/uL (ref 1500–7800)
Neutrophils Relative %: 50.5 %
Platelets: 407 Thousand/uL — ABNORMAL HIGH (ref 140–400)
RBC: 4.09 Million/uL (ref 3.80–5.10)
RDW: 11.8 % (ref 11.0–15.0)
Total Lymphocyte: 39.3 %
WBC: 4.8 Thousand/uL (ref 3.8–10.8)

## 2024-08-06 LAB — LIPID PANEL
Cholesterol: 178 mg/dL (ref <200)
HDL: 63 mg/dL (ref >=50)
LDL Cholesterol (Calc): 101 mg/dL — ABNORMAL HIGH
Non-HDL Cholesterol (Calc): 115 mg/dL (ref <130)
Total CHOL/HDL Ratio: 2.8 (calc) (ref <5.0)
Triglycerides: 53 mg/dL (ref <150)

## 2024-08-06 LAB — TSH: TSH: 1 m[IU]/L (ref 0.40–4.50)

## 2024-08-11 ENCOUNTER — Ambulatory Visit: Payer: Self-pay | Admitting: Family

## 2024-08-11 NOTE — Telephone Encounter (Signed)
 Pt wants to know if Ngetich, Penny C, NP will be prescribing medication or a GLP patch regarding her labwork.    Copied from CRM [id number]. Topic: Clinical - Lab/Test Results >> Aug 11, 2024  2:19 PM Penny Ward wrote: Reason for CRM: Patient called and she had a few questions about her lab. She is asking that she notice on her paperwork and it states her BMI is up and it mention obesity and her cholesterol is still up, will the provider prescribe her a GLP patch or some type of medicine for it. Could you assist? Patients callback number is [phone].

## 2024-08-16 ENCOUNTER — Encounter: Admitting: Family

## 2024-09-04 NOTE — Progress Notes (Signed)
 This encounter was created in error - please disregard.

## 2024-11-28 ENCOUNTER — Encounter: Payer: Self-pay | Admitting: Gastroenterology

## 2025-02-08 ENCOUNTER — Ambulatory Visit: Payer: Self-pay | Admitting: Family
